# Patient Record
Sex: Male | Born: 1955 | Race: White | Hispanic: No | Marital: Married
Health system: Midwestern US, Academic
[De-identification: ages and names within clinical notes are randomized; demographics above are authoritative.]

---

## 2016-06-16 MED ORDER — ISOSORBIDE MONONITRATE 30 MG PO TB24
30 mg | ORAL_TABLET | Freq: Every morning | ORAL | 3 refills | 30.00000 days | Status: DC
Start: 2016-06-16 — End: 2017-06-13

## 2016-07-10 ENCOUNTER — Encounter: Admit: 2016-07-10 | Discharge: 2016-07-10 | Payer: MEDICARE

## 2016-07-10 MED ORDER — FUROSEMIDE 40 MG PO TAB
ORAL_TABLET | Freq: Every day | ORAL | 3 refills | 90.00000 days | Status: AC
Start: 2016-07-10 — End: 2017-07-13

## 2016-07-10 MED ORDER — CARVEDILOL 6.25 MG PO TAB
ORAL_TABLET | Freq: Two times a day (BID) | ORAL | 3 refills | 90.00000 days | Status: AC
Start: 2016-07-10 — End: 2017-07-13

## 2016-07-12 ENCOUNTER — Encounter: Admit: 2016-07-12 | Discharge: 2016-07-12 | Payer: MEDICARE

## 2016-07-12 NOTE — Progress Notes
Records Request    Medical records request for continuation of care:    Patient has appointment on 07/27/16   with  Dr. Danella MaiersSteven Owens* .    Please fax records to [facility]  [phone]    Request records:        Recent Labs      Thank you,      [facility]  [facility]  [address]  [place], [place] [id number]  Phone:  [phone]  Fax:  [phone]

## 2016-07-14 LAB — CBC
Lab: 15 mg/dL (ref 8.5–10.6)
Lab: 192 U/L — ABNORMAL HIGH (ref 0.00–1.00)
Lab: 31 g/dL (ref 3.5–5.0)
Lab: 33 U/L (ref 25–110)
Lab: 4.9 mg/dL — ABNORMAL HIGH (ref 21.0–35.0)
Lab: 46 g/dL (ref 6.0–8.0)
Lab: 6.6 mg/dL — ABNORMAL HIGH (ref 30–200)
Lab: 93 mg/dL (ref 0.3–1.2)

## 2016-07-14 LAB — COMPREHENSIVE METABOLIC PANEL
Lab: 141 MMOL/L (ref 98–110)
Lab: 25 (ref 3–12)
Lab: 43 mL/min (ref >60)
Lab: 9
Lab: >60
Lab: >60 mL/min (ref >60)

## 2016-07-14 LAB — LIPID PROFILE: Lab: 126 mg/dL (ref 70–100)

## 2016-07-17 ENCOUNTER — Encounter: Admit: 2016-07-17 | Discharge: 2016-07-17 | Payer: MEDICARE

## 2016-07-27 ENCOUNTER — Encounter: Admit: 2016-07-27 | Discharge: 2016-07-27 | Payer: MEDICARE

## 2016-07-27 ENCOUNTER — Ambulatory Visit: Admit: 2016-07-27 | Discharge: 2016-07-28 | Payer: MEDICARE

## 2016-07-27 DIAGNOSIS — Z9981 Dependence on supplemental oxygen: Secondary | ICD-10-CM

## 2016-07-27 DIAGNOSIS — I429 Cardiomyopathy, unspecified: ICD-10-CM

## 2016-07-27 DIAGNOSIS — Z951 Presence of aortocoronary bypass graft: ICD-10-CM

## 2016-07-27 DIAGNOSIS — R0600 Dyspnea, unspecified: ICD-10-CM

## 2016-07-27 DIAGNOSIS — J45909 Unspecified asthma, uncomplicated: ICD-10-CM

## 2016-07-27 DIAGNOSIS — E7800 Pure hypercholesterolemia, unspecified: ICD-10-CM

## 2016-07-27 DIAGNOSIS — R000 Tachycardia, unspecified: ICD-10-CM

## 2016-07-27 DIAGNOSIS — I2510 Atherosclerotic heart disease of native coronary artery without angina pectoris: Principal | ICD-10-CM

## 2016-07-27 DIAGNOSIS — I119 Hypertensive heart disease without heart failure: ICD-10-CM

## 2016-07-27 DIAGNOSIS — G2581 Restless legs syndrome: ICD-10-CM

## 2016-07-27 DIAGNOSIS — E785 Hyperlipidemia, unspecified: ICD-10-CM

## 2016-07-27 DIAGNOSIS — Z91041 Radiographic dye allergy status: ICD-10-CM

## 2016-07-27 DIAGNOSIS — J449 Chronic obstructive pulmonary disease, unspecified: ICD-10-CM

## 2016-07-27 DIAGNOSIS — Z87891 Personal history of nicotine dependence: ICD-10-CM

## 2016-07-27 DIAGNOSIS — I10 Essential (primary) hypertension: ICD-10-CM

## 2016-07-27 DIAGNOSIS — I5022 Chronic systolic (congestive) heart failure: ICD-10-CM

## 2016-07-27 DIAGNOSIS — I255 Ischemic cardiomyopathy: ICD-10-CM

## 2016-07-27 NOTE — Assessment & Plan Note
No angina or other symptoms to suggest progression of coronary disease.

## 2016-07-27 NOTE — Assessment & Plan Note
Lab Results   Component Value Date    CHOL 126 07/14/2016    TRIG 207 (H) 07/14/2016    HDL 35 07/14/2016    LDL 72.0 07/14/2016    VLDL 36 05/06/2012    NONHDLCHOL 86 05/06/2012    CHOLHDLC 3.7 01/04/2011

## 2016-07-27 NOTE — Assessment & Plan Note
He is always had a tendency to have a rather rapid heart rate and this is particularly the case when he exerts at all.

## 2016-07-27 NOTE — Progress Notes
Date of Service: 07/27/2016    Joseph Dougherty is a 61 y.o. male.       HPI     [name] and Pam were in the [place] office today for follow-up regarding coronary disease.  He has had progressive difficulty with his breathing, related to his severe COPD.  He tends to get a fairly rapid heart rate when he does much exertion at all.  Fortunately she has not required hospitalization since I last saw him 6 months ago and he attributes this to the azithromycin that he has been taking 3 times per week.    He has not had any angina.  He denies any syncope or near syncope.  He has not had trouble with peripheral edema.         Vitals:    07/27/16 1354 07/27/16 1400   BP: 124/86 132/84   Pulse: 100    Weight: 79.8 kg (176 lb)    Height: 1.702 m (5' 7)      Body mass index is 27.57 kg/m???.     Past Medical History  Patient Active Problem List    Diagnosis Date Noted   ??? Ischemic cardiomyopathy 05/31/2012   ??? Dependence on supplemental oxygen 05/31/2012   ??? Dyspnea on exertion 05/31/2012   ??? 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 Restless legs syndrome 01/17/2011   ??? Sinus tachycardia 08/25/2008   ??? History of tobacco abuse 08/25/2008   ??? Hypercholesterolemia 08/25/2008   ??? Allergy to radiographic contrast media 01/15/2007   ??? Exertional chest pain 01/14/2007     Abnormal stress thallium     ??? CAD (coronary artery disease) 03/30/2006      9/05 - Inferior MI with thrombolytic, anatomy not suitable for PCI   9/05 - Emergent CABG x 2: SVG-right posterolateral and SVG-ramus.   11/05 - ECOD: LVEF 50%, mild posterior hypokinesis.  (EF 59% per LVgram).                 Status post cardiac rehab participation.   5/06 - Persantine Myoview stress test ([place], [place]):  EF 60%, no ischemia   12/08 - Cardiac cath: mild LV dysfunction with previous inferior wall infarction.                Two vessel CAD with patent vein graft to intermediate vessel and patent                vein graft to RPLV branch. No significant LAD or Cx disease. Allergic reaction to contrast dye with manifestation of hives treated with Benadryl                and Solu-Medrol.  05/2012 - Cath @ [facility].  No significant progression of CAD.  Grafts patent.  EF 45-50%.     ??? COPD (chronic obstructive pulmonary disease) (HCC) 03/30/2006     He has a long smoking history and was first told he had COPD in 2008 and initially saw Dr. Trixie Dredge. Earlene Plater in the fellows clinic.  He has required hospitalization nearly yearly the last 3-4 years.  He thinks he may have had a pneumothorax in the past also.  He was started on nocturnal oxygen late 2008.   He has normal alpha one antitrypsin testing in 2008.   He quit smoking in 1/13.  He has been on Spiriva and Advair for several years but switched to Symbicort in 2014.  Azithromycin started 11/2013  Review of Systems   Constitution: Positive for weakness and malaise/fatigue.   HENT: Positive for congestion, ear pain and stridor.    Eyes: Negative.    Cardiovascular: Positive for claudication, dyspnea on exertion, near-syncope and paroxysmal nocturnal dyspnea.   Respiratory: Positive for cough, shortness of breath, sleep disturbances due to breathing, sputum production and wheezing.    Endocrine: Positive for cold intolerance and heat intolerance.   Hematologic/Lymphatic: Negative.    Skin: Positive for dry skin.   Musculoskeletal: Positive for muscle cramps.   Gastrointestinal: Positive for bloating.   Genitourinary: Positive for nocturia.   Psychiatric/Behavioral: The patient has insomnia.    Allergic/Immunologic: Negative.        Physical Exam    Physical Exam   General Appearance: no distress   Skin: warm, no ulcers or xanthomas   Digits and Nails: no cyanosis or clubbing   Eyes: conjunctivae and lids normal, pupils are equal and round   Teeth/Gums/Palate: dentition unremarkable, no lesions   Lips & Oral Mucosa: no pallor or cyanosis   Neck Veins: normal JVP , neck veins are not distended Thyroid: no nodules, masses, tenderness or enlargement   Chest Inspection: chest is normal in appearance   Respiratory Effort: mild dyspnea at rest  Auscultation/Percussion: lungs clear to auscultation, no rales or rhonchi, no wheezing, markedly diminished breath sounds bilaterally  PMI: PMI not enlarged or displaced   Cardiac Rhythm: regular rhythm and normal rate   Cardiac Auscultation: S1, S2 normal, no rub, no gallop   Murmurs: no murmur   Peripheral Circulation: normal peripheral circulation   Carotid Arteries: normal carotid upstroke bilaterally, no bruits   Radial Arteries: normal symmetric radial pulses   Abdominal Aorta: no abdominal aortic bruit   Pedal Pulses: normal symmetric pedal pulses   Lower Extremity Edema: no lower extremity edema   Abdominal Exam: soft, non-tender, no masses, bowel sounds normal   Liver & Spleen: no organomegaly   Gait & Station: walks without assistance   Muscle Strength: normal muscle tone   Orientation: oriented to time, place and person   Affect & Mood: appropriate and sustained affect   Language and Memory: patient responsive and seems to comprehend information   Neurologic Exam: neurological assessment grossly intact   Other: moves all extremities      Problems Addressed Today  Encounter Diagnoses   Name Primary?   ??? Coronary artery disease involving native coronary artery of native heart without angina pectoris    ??? Hypercholesterolemia        Assessment and Plan       CAD (coronary artery disease)  No angina or other symptoms to suggest progression of coronary disease.    COPD (chronic obstructive pulmonary disease)  He is actually done well on azithromycin 3 times per week and has not been hospitalized since I saw him 6 months ago.    Sinus tachycardia  He is always had a tendency to have a rather rapid heart rate and this is particularly the case when he exerts at all.    Hypercholesterolemia  Lab Results   Component Value Date    CHOL 126 07/14/2016 TRIG 207 (H) 07/14/2016    HDL 35 07/14/2016    LDL 72.0 07/14/2016    VLDL 36 05/06/2012    NONHDLCHOL 86 05/06/2012    CHOLHDLC 3.7 01/04/2011            Current Medications (including today's revisions)  ??? albuterol 0.083% (PROVENTIL; VENTOLIN) 2.5 mg /3 mL (0.083 %)  nebulizer solution Inhale 2.5 mg solution as directed every 4 hours as needed.     ??? aspirin 325 mg tablet Take 325 mg by mouth daily.   ??? atorvastatin (LIPITOR) 40 mg tablet TAKE 1 TABLET DAILY   ??? azithromycin (ZITHROMAX) 250 mg tablet 1 Tab three times weekly. Marland Kitchen   ??? budesonide/formoterol (SYMBICORT) 80-4.5 mcg/actuation HFAA inhalation Inhale 2 Puffs by mouth twice daily.   ??? carvedilol (COREG) 6.25 mg tablet TAKE 1 TABLET TWICE A DAY WITH MEALS   ??? ezetimibe (ZETIA) 10 mg tablet Take 1 tablet by mouth daily.   ??? furosemide (LASIX) 40 mg tablet TAKE 1 TABLET DAILY   ??? isosorbide mononitrate SR (IMDUR) 30 mg tablet Take 1 tablet by mouth every morning.   ??? nitroglycerin (NITROSTAT) 0.4 mg tablet 1 Tab every 5 minutes as needed for Chest Pain. FOR CHEST PAIN   ??? potassium chloride SR (K-DUR) 20 mEq tablet TAKE 1 TABLET DAILY   ??? sertraline (ZOLOFT) 100 mg tablet Take 100 mg by mouth daily.   ??? temazepam (RESTORIL) 15 mg capsule Take 1 Cap by mouth at bedtime as needed.   ??? tiotropium (SPIRIVA WITH HANDIHALER) 18 mcg inhaler Inhale 18 mcg by mouth Daily.

## 2016-07-27 NOTE — Assessment & Plan Note
He is actually done well on azithromycin 3 times per week and has not been hospitalized since I saw him 6 months ago.

## 2016-07-31 ENCOUNTER — Encounter: Admit: 2016-07-31 | Discharge: 2016-07-31 | Payer: MEDICARE

## 2016-07-31 MED ORDER — ATORVASTATIN 40 MG PO TAB
ORAL_TABLET | Freq: Every day | 3 refills | Status: AC
Start: 2016-07-31 — End: 2017-07-23

## 2016-08-14 ENCOUNTER — Encounter: Admit: 2016-08-14 | Discharge: 2016-08-14 | Payer: MEDICARE

## 2016-08-14 MED ORDER — POTASSIUM CHLORIDE 20 MEQ PO TBTQ
ORAL_TABLET | Freq: Every day | 2 refills | 30.00000 days | Status: AC
Start: 2016-08-14 — End: 2016-10-03

## 2016-09-15 ENCOUNTER — Encounter: Admit: 2016-09-15 | Discharge: 2016-09-15 | Payer: MEDICARE

## 2016-09-15 DIAGNOSIS — Z9981 Dependence on supplemental oxygen: Secondary | ICD-10-CM

## 2016-09-15 DIAGNOSIS — R000 Tachycardia, unspecified: ICD-10-CM

## 2016-09-15 DIAGNOSIS — J449 Chronic obstructive pulmonary disease, unspecified: ICD-10-CM

## 2016-09-15 DIAGNOSIS — R079 Chest pain, unspecified: ICD-10-CM

## 2016-09-15 DIAGNOSIS — R0600 Dyspnea, unspecified: ICD-10-CM

## 2016-09-15 DIAGNOSIS — E785 Hyperlipidemia, unspecified: ICD-10-CM

## 2016-09-15 DIAGNOSIS — Z951 Presence of aortocoronary bypass graft: ICD-10-CM

## 2016-09-15 DIAGNOSIS — J45909 Unspecified asthma, uncomplicated: ICD-10-CM

## 2016-09-15 DIAGNOSIS — I10 Essential (primary) hypertension: ICD-10-CM

## 2016-09-15 DIAGNOSIS — I255 Ischemic cardiomyopathy: ICD-10-CM

## 2016-09-15 DIAGNOSIS — I429 Cardiomyopathy, unspecified: ICD-10-CM

## 2016-09-15 DIAGNOSIS — I5022 Chronic systolic (congestive) heart failure: ICD-10-CM

## 2016-09-15 DIAGNOSIS — E7800 Pure hypercholesterolemia, unspecified: ICD-10-CM

## 2016-09-15 DIAGNOSIS — Z91041 Radiographic dye allergy status: ICD-10-CM

## 2016-09-15 DIAGNOSIS — I2510 Atherosclerotic heart disease of native coronary artery without angina pectoris: ICD-10-CM

## 2016-09-15 DIAGNOSIS — G2581 Restless legs syndrome: ICD-10-CM

## 2016-09-15 DIAGNOSIS — Z87891 Personal history of nicotine dependence: ICD-10-CM

## 2016-09-15 DIAGNOSIS — I119 Hypertensive heart disease without heart failure: ICD-10-CM

## 2016-09-15 MED ORDER — ASPIRIN 325 MG PO TAB
325 mg | Freq: Once | ORAL | 0 refills | Status: DC
Start: 2016-09-15 — End: 2016-09-16

## 2016-09-16 ENCOUNTER — Inpatient Hospital Stay: Admit: 2016-09-16 | Discharge: 2016-09-16 | Payer: MEDICARE

## 2016-09-16 ENCOUNTER — Emergency Department: Admit: 2016-09-16 | Discharge: 2016-09-16 | Payer: MEDICARE

## 2016-09-16 ENCOUNTER — Inpatient Hospital Stay
Admit: 2016-09-16 | Discharge: 2016-09-16 | Disposition: A | Discharge status: Home / Self Care | Payer: MEDICARE | Admitting: Student in an Organized Health Care Education/Training Program

## 2016-09-16 ENCOUNTER — Encounter: Admit: 2016-09-16 | Discharge: 2016-09-16 | Payer: MEDICARE

## 2016-09-16 DIAGNOSIS — I252 Old myocardial infarction: ICD-10-CM

## 2016-09-16 DIAGNOSIS — I255 Ischemic cardiomyopathy: ICD-10-CM

## 2016-09-16 DIAGNOSIS — R000 Tachycardia, unspecified: ICD-10-CM

## 2016-09-16 DIAGNOSIS — Z9981 Dependence on supplemental oxygen: ICD-10-CM

## 2016-09-16 DIAGNOSIS — Z79899 Other long term (current) drug therapy: ICD-10-CM

## 2016-09-16 DIAGNOSIS — I4510 Unspecified right bundle-branch block: ICD-10-CM

## 2016-09-16 DIAGNOSIS — I5022 Chronic systolic (congestive) heart failure: ICD-10-CM

## 2016-09-16 DIAGNOSIS — Z951 Presence of aortocoronary bypass graft: ICD-10-CM

## 2016-09-16 DIAGNOSIS — Z87891 Personal history of nicotine dependence: ICD-10-CM

## 2016-09-16 DIAGNOSIS — G2581 Restless legs syndrome: ICD-10-CM

## 2016-09-16 DIAGNOSIS — R0789 Other chest pain: Principal | ICD-10-CM

## 2016-09-16 DIAGNOSIS — I110 Hypertensive heart disease with heart failure: ICD-10-CM

## 2016-09-16 DIAGNOSIS — E7800 Pure hypercholesterolemia, unspecified: ICD-10-CM

## 2016-09-16 DIAGNOSIS — J9610 Chronic respiratory failure, unspecified whether with hypoxia or hypercapnia: ICD-10-CM

## 2016-09-16 DIAGNOSIS — E785 Hyperlipidemia, unspecified: ICD-10-CM

## 2016-09-16 DIAGNOSIS — J449 Chronic obstructive pulmonary disease, unspecified: ICD-10-CM

## 2016-09-16 DIAGNOSIS — I25119 Atherosclerotic heart disease of native coronary artery with unspecified angina pectoris: ICD-10-CM

## 2016-09-16 LAB — TROPONIN-I
Lab: 0 ng/mL (ref 0.0–0.05)
Lab: 0 ng/mL (ref 0.0–0.05)

## 2016-09-16 LAB — COMPREHENSIVE METABOLIC PANEL
Lab: 1 mg/dL (ref 0.4–1.24)
Lab: 1.4 mg/dL — ABNORMAL HIGH (ref 0.3–1.2)
Lab: 107 mg/dL — ABNORMAL HIGH (ref 70–100)
Lab: 137 MMOL/L (ref 137–147)
Lab: 16 mg/dL (ref 7–25)
Lab: 25 U/L (ref 7–40)
Lab: 35 MMOL/L — ABNORMAL HIGH (ref 21–30)
Lab: 4 MMOL/L (ref 3.5–5.1)
Lab: 4.6 g/dL (ref 3.5–5.0)
Lab: 46 U/L (ref 7–56)
Lab: 5 10*3/uL (ref 3–12)
Lab: 94 U/L — ABNORMAL LOW (ref 25–110)
Lab: >60 mL/min (ref >60)
Lab: >60 mL/min (ref >60)
Lab: 0.8 mg/dL (ref 0.4–1.24)
Lab: 1.5 mg/dL — ABNORMAL HIGH (ref 0.3–1.2)
Lab: 127 mg/dL — ABNORMAL HIGH (ref 70–100)
Lab: 136 MMOL/L — ABNORMAL LOW (ref 137–147)
Lab: 25 U/L (ref 7–40)
Lab: 3.8 MMOL/L (ref 3.5–5.1)
Lab: 31 MMOL/L — ABNORMAL HIGH (ref 21–30)
Lab: 4.3 g/dL (ref 3.5–5.0)
Lab: 43 U/L (ref 7–56)
Lab: 7 10*3/uL (ref 3–12)
Lab: 7.1 g/dL (ref 6.0–8.0)
Lab: 9.8 mg/dL (ref 8.5–10.6)
Lab: 90 U/L (ref 25–110)
Lab: >60 mL/min (ref >60)
Lab: >60 mL/min (ref >60)

## 2016-09-16 LAB — CBC AND DIFF
Lab: 0 10*3/uL (ref 0–0.20)
Lab: 0.2 10*3/uL (ref 0–0.45)
Lab: 7.2 10*3/uL (ref 4.5–11.0)
Lab: 0 10*3/uL (ref 0–0.20)
Lab: 4.6 M/UL (ref 4.4–5.5)
Lab: 6.3 10*3/uL (ref 4.5–11.0)

## 2016-09-16 LAB — TSH WITH FREE T4 REFLEX: Lab: 6.7 uU/mL — ABNORMAL HIGH (ref 0.35–5.00)

## 2016-09-16 LAB — PROTIME INR (PT): Lab: 1 mg/dL (ref 0.8–1.2)

## 2016-09-16 LAB — PHOSPHORUS: Lab: 3 mg/dL (ref 2.0–4.0)

## 2016-09-16 LAB — FREE T4-FREE THYROXINE: Lab: 1 ng/dL (ref 0.6–1.6)

## 2016-09-16 LAB — MAGNESIUM
Lab: 2.4 mg/dL — ABNORMAL LOW (ref 1.6–2.6)
Lab: 2.2 mg/dL (ref 1.6–2.6)

## 2016-09-16 LAB — BNP POC ER: Lab: 46 pg/mL (ref 0–100)

## 2016-09-16 MED ORDER — SODIUM CHLORIDE 0.9 % IV SOLP
250 mL | INTRAVENOUS | 0 refills | Status: DC | PRN
Start: 2016-09-16 — End: 2016-09-17

## 2016-09-16 MED ORDER — ALBUTEROL SULFATE 2.5 MG /3 ML (0.083 %) IN NEBU
2.5 mg | RESPIRATORY_TRACT | 0 refills | Status: DC | PRN
Start: 2016-09-16 — End: 2016-09-17

## 2016-09-16 MED ORDER — TEMAZEPAM 15 MG PO CAP
15 mg | Freq: Every evening | ORAL | 0 refills | Status: DC | PRN
Start: 2016-09-16 — End: 2016-09-17

## 2016-09-16 MED ORDER — ENOXAPARIN 40 MG/0.4 ML SC SYRG
40 mg | Freq: Every day | SUBCUTANEOUS | 0 refills | Status: DC
Start: 2016-09-16 — End: 2016-09-17
  Administered 2016-09-16: 08:00:00 40 mg via SUBCUTANEOUS

## 2016-09-16 MED ORDER — SERTRALINE 100 MG PO TAB
100 mg | Freq: Every day | ORAL | 0 refills | Status: DC
Start: 2016-09-16 — End: 2016-09-17
  Administered 2016-09-16: 17:00:00 100 mg via ORAL

## 2016-09-16 MED ORDER — NITROGLYCERIN 0.4 MG SL SUBL
.4 mg | SUBLINGUAL | 0 refills | Status: DC | PRN
Start: 2016-09-16 — End: 2016-09-17

## 2016-09-16 MED ORDER — ISOSORBIDE MONONITRATE 30 MG PO TB24
30 mg | Freq: Every morning | ORAL | 0 refills | Status: DC
Start: 2016-09-16 — End: 2016-09-17
  Administered 2016-09-16: 20:00:00 30 mg via ORAL

## 2016-09-16 MED ORDER — ATORVASTATIN 40 MG PO TAB
40 mg | Freq: Every day | ORAL | 0 refills | Status: DC
Start: 2016-09-16 — End: 2016-09-17
  Administered 2016-09-16: 17:00:00 40 mg via ORAL

## 2016-09-16 MED ORDER — MELATONIN 5 MG PO TAB
5 mg | Freq: Every evening | ORAL | 0 refills | Status: DC | PRN
Start: 2016-09-16 — End: 2016-09-17

## 2016-09-16 MED ORDER — ONDANSETRON HCL (PF) 4 MG/2 ML IJ SOLN
4 mg | INTRAVENOUS | 0 refills | Status: DC | PRN
Start: 2016-09-16 — End: 2016-09-17

## 2016-09-16 MED ORDER — BUDESONIDE-FORMOTEROL 80-4.5 MCG/ACTUATION IN HFAA
2 | Freq: Two times a day (BID) | RESPIRATORY_TRACT | 0 refills | Status: DC
Start: 2016-09-16 — End: 2016-09-17
  Administered 2016-09-16: 11:00:00 2 via RESPIRATORY_TRACT

## 2016-09-16 MED ORDER — ASPIRIN 325 MG PO TAB
325 mg | Freq: Every day | ORAL | 0 refills | Status: DC
Start: 2016-09-16 — End: 2016-09-17
  Administered 2016-09-16: 17:00:00 325 mg via ORAL

## 2016-09-16 MED ORDER — POTASSIUM CHLORIDE 20 MEQ PO TBTQ
20 meq | Freq: Every day | ORAL | 0 refills | Status: DC
Start: 2016-09-16 — End: 2016-09-17
  Administered 2016-09-16: 20:00:00 20 meq via ORAL

## 2016-09-16 MED ORDER — FUROSEMIDE 40 MG PO TAB
40 mg | Freq: Every day | ORAL | 0 refills | Status: DC
Start: 2016-09-16 — End: 2016-09-17
  Administered 2016-09-16: 20:00:00 40 mg via ORAL

## 2016-09-16 MED ORDER — ALBUTEROL SULFATE 90 MCG/ACTUATION IN HFAA
2 | RESPIRATORY_TRACT | 0 refills | Status: DC | PRN
Start: 2016-09-16 — End: 2016-09-17

## 2016-09-16 MED ORDER — AMINOPHYLLINE 500 MG/20 ML IV SOLN
50 mg | INTRAVENOUS | 0 refills | Status: DC | PRN
Start: 2016-09-16 — End: 2016-09-17

## 2016-09-16 MED ORDER — REGADENOSON 0.4 MG/5 ML IV SYRG
.4 mg | Freq: Once | INTRAVENOUS | 0 refills | Status: CP
Start: 2016-09-16 — End: ?
  Administered 2016-09-16: 15:00:00 0.4 mg via INTRAVENOUS

## 2016-09-16 MED ORDER — EZETIMIBE 10 MG PO TAB
10 mg | Freq: Every day | ORAL | 0 refills | Status: DC
Start: 2016-09-16 — End: 2016-09-17
  Administered 2016-09-16: 17:00:00 10 mg via ORAL

## 2016-09-16 MED ORDER — TIOTROPIUM BROMIDE 18 MCG IN CPDV
1 | Freq: Every day | RESPIRATORY_TRACT | 0 refills | Status: DC
Start: 2016-09-16 — End: 2016-09-17
  Administered 2016-09-16: 11:00:00 1 via RESPIRATORY_TRACT

## 2016-09-16 MED ORDER — ACETAMINOPHEN 325 MG PO TAB
650 mg | ORAL | 0 refills | Status: DC | PRN
Start: 2016-09-16 — End: 2016-09-17

## 2016-09-16 MED ORDER — AZITHROMYCIN 250 MG PO TAB
250 mg | ORAL | 0 refills | Status: DC
Start: 2016-09-16 — End: 2016-09-17

## 2016-09-16 MED ORDER — SODIUM CHLORIDE 0.9 % IV SOLP
250 mL | Freq: Once | INTRAVENOUS | 0 refills | Status: CP
Start: 2016-09-16 — End: ?
  Administered 2016-09-16: 08:00:00 250 mL via INTRAVENOUS

## 2016-09-16 MED ORDER — CARVEDILOL 6.25 MG PO TAB
6.25 mg | Freq: Two times a day (BID) | ORAL | 0 refills | Status: DC
Start: 2016-09-16 — End: 2016-09-17
  Administered 2016-09-16: 17:00:00 6.25 mg via ORAL

## 2016-09-16 MED ORDER — SODIUM CHLORIDE 0.9 % IV SOLP
1000 mL | Freq: Once | INTRAVENOUS | 0 refills | Status: DC
Start: 2016-09-16 — End: 2016-09-16

## 2016-09-16 NOTE — Progress Notes
Patient arrived to room # 509 via transport accompanied. Patient transferred to the bed without assistance. Bedside safety checks completed. Initial patient assessment completed, refer to flowsheet for details. Admission skin assessment completed by: Percell BostonMeagan  RN    Pressure Injury Present on Hospital Admission (within 24 hours): no     1. Occiput: no  2. Ear: no  3. Scapula: no  4. Spinous Process: no  5. Shoulder: no  6. Elbow: no  7. Iliac Crest: no  8. Sacrum/Coccyx: no  9. Ischial Tuberosity: no  10. Trochanter: no  11. Knee: no  12. Malleolus: no  13. Heel: no  14. Toes: no  15. Assessed for device associated injury: no  16. Nursing Nutrition Assessment Completed: yes     See Doc Flowsheet for additional wound details.

## 2016-09-16 NOTE — Progress Notes
@   approx 1800  discharge complete. Iv and tele removed prior to discharge without any difficulty or issues. Pt v/u of all discharge instructions. Pt connected himself to his portable 02 tank. Pt transported to lobby per wheelchair by myself, pts wife @ curbside to drive pt home. Blima RichJill  RN

## 2016-09-16 NOTE — ED Notes
Report given to Aundra MilletMegan, RN - CVP

## 2016-09-16 NOTE — Care Coordination-Inpatient
Patient assigned to Med 3. Pager [phone]

## 2016-09-16 NOTE — Discharge Instructions - Appointments
Please call your cardiologist on Monday 09-18-16 to ask if they need a hospital follow-up appointment for your short admission and perfusion scan.

## 2016-09-16 NOTE — Progress Notes
Heart Failure Nursing Progress Note    Admission Date: 09/15/2016  LOS: 0 days    Admission Weight: 76.4 kg (168 lb 8 oz)        Most recent weights (inpatient):   Vitals:    09/16/16 0256   Weight: 76.4 kg (168 lb 8 oz)     Weight change from previous day: no previous weight    Fluid restriction ordered: NPO at this time    Intake/Output Summary: (Last 24 hours)    Intake/Output Summary (Last 24 hours) at 09/16/16 0457  Last data filed at 09/16/16 0428   Gross per 24 hour   Intake                0 ml   Output                0 ml   Net                0 ml       Is patient incontinent: No     Anticipated discharge date: TBD  Discharge goals: Stress test      Daily Assessment of Patient Stated Goals:    Short Term Goal Identified by patient (Short Term=during hospitalization):  "to eat soon"

## 2016-09-16 NOTE — Discharge Instructions - Pharmacy
Physician Discharge Summary      Name: Joseph Dougherty  Medical Record Number: [id number]        Account Number:  [id number]  Date Of Birth:  [birth year]                         Age:  61 years   Admit date:  09/15/2016                     Discharge date:  09/16/2016    Attending Physician:  Dr. Adrian Saran               Service: Med 3- 2071    Physician Summary completed by: Thelma Barge. Joseph Russomanno, MD    Reason for hospitalization: Chest pain    Significant PMH:   Past Medical History:   Diagnosis Date   ??? Allergy to radiographic contrast media 01/15/2007   ??? Asthma    ??? cad    ??? CAD (coronary artery disease)    ??? Cardiomyopathy (HCC)    ??? Chronic systolic heart failure (HCC) 05/31/2012   ??? COPD (chronic obstructive pulmonary disease) (HCC) 03/30/2006   ??? Dependence on supplemental oxygen 05/31/2012   ??? Dyspnea on exertion 05/31/2012   ??? History of tobacco abuse 08/25/2008   ??? 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 Hypercholesterolemia 08/25/2008   ??? Hyperlipemia    ??? Hypertension    ??? Hypertensive cardiovascular disease    ??? Ischemic cardiomyopathy 05/31/2012   ??? On supplemental oxygen therapy    ??? Restless legs syndrome 01/17/2011   ??? Sinus tachycardia 08/25/2008         Allergies: Contrast dye iv, iodine containing [iodinated contrast- oral and iv dye]; Effexor [venlafaxine]; Penicillin g; and Shellfish containing products    Admission Physical Exam notable for:  Vitals: BP: 145/90 (08/10 2300)  Temp: 37.1 ???C (98.7 ???F) (08/10 2003)  Pulse: 95 (08/10 2300)  Respirations: 13 PER MINUTE (08/10 2300)  SpO2: 97 % (08/10 2300)  O2 Delivery: Nasal Cannula (08/10 2003)  SpO2 Pulse: 90 (08/10 2300)  General:  Alert and response to question appropriately.  VS as above. No acute distress .     Lungs:  No use of accessory muscles.  Clear to auscultation bilaterally without wheezes, rales, or rhonchi. Resting comfortably on 3L of O2 vial NC   Cardiovascular: Regular rate, S1, S2 normal, no murmurs, clicks, rubs, or gallops appreciated .  2+ and symmetric, all extremities.  No edema in BLE.     Admission Lab/Radiology studies notable for:   Troponin-I 0.03  BNP 46  CT Head: No acute hemorrhage  TSH: 6.790, T4-Free 1.0  Chest X-ray: Heart and pulmonary vasculature within normal limits    Brief Hospital Course:  The patient was admitted and the following issues were addressed during this hospitalization: (with pertinent details).  Joseph Dougherty is a 61 yo M with a PMHx significant for CAD s/p CABG x2, COPD, sinus tachycardia presenting with chest pain.  The patients chest pain was thoroughly evaluated with CXR, lab tests, history and physical exam.  The course and character of his chest pain was non-cardiac in nature with no acute signs of ischemia or other life threatening pathology.  However, given Joseph Dougherty risk factors, he was admitted for an MPI Stress Test.  The stress test was ordered and by morning, Joseph Dougherty chest pain had entirely resolved.  The stress test was negative for ischemia  and the perfusion was similar to his last stress test done in 05/2011, indicating no significant interval change.  Joseph Dougherty was informed of the results and asked to see if his cardiologist wanted a follow-up to go over the test results by Monday August 13th, 2018.  He was discharged at his normal baseline symptoms and functional level.      Condition at Discharge: Stable    Discharge Diagnoses:      Hospital Problems        Active Problems    * (Principal)Other chest pain    CAD (coronary artery disease)    COPD (chronic obstructive pulmonary disease) (HCC)    Sinus tachycardia    Chest pain          Surgical Procedures: None    Significant Diagnostic Studies and Procedures: noted in brief hospital course    Consults:  None    Patient Disposition: Home       Patient instructions/medications:     Activity as Tolerated   It is important to keep increasing your activity level after you leave the hospital.  Moving around can help prevent blood clots, lung infection (pneumonia) and other problems.  Gradually increasing the number of times you are up moving around will help you return to your normal activity level more quickly.  Continue to increase the number of times you are up to the chair and walking daily to return to your normal activity level. Begin to work toward your normal activity level at discharge     Questions About Your Stay   For questions or concerns regarding your hospital stay. Call [phone]   Discharging attending physician: Adrian Saran [[id number]]      Cardiac Diet   Limiting unhealthy fats and cholesterol is the most important step you can take in reducing your risk for cardiovascular disease.  Unhealthy fats include saturated and trans fats.  Monitor your sodium and cholesterol intake.  Restrict your sodium to 2g (grams) or 2000mg  (milligrams) daily, and your cholesterol to 200mg  daily.    If you have questions regarding your diet at home, you may contact a dietitian at [phone].       Report These Signs and Symptoms   Please contact your doctor if you have any of the following symptoms: temperature higher than 100 degrees F, uncontrolled pain, persistent nausea and/or vomiting, difficulty breathing, chest pain, severe abdominal pain, headache, unable to urinate, unable to have bowel movement or drainage with a foul odor     Heart Failure Information   You are at risk for readmission to the hospital because of fluid overload. There are steps you can take to lower your risk:  *Continue your current heart medications. Changes made have been made during your hospital stay.  *Remember to weigh yourself every morning, first thing after you urinate, and write down your weigh. Take this record to your doctor's appointments.  *Chart your symptoms - fatigue, shortness of breath, swelling, etc. Immediately report any worsening. *Remember to consume no more than 2,000mg  (milligrams) of sodium daily. Watch out for packaged, processed, canned, and restaurant foods especially.  *If any of your symptoms worsen, or if you gain more than 2 pounds in 24 hours, or 5 pounds in a week, call your cardiologist immediately. EARLY REPORTING OF THESE CHANGES IS VERY IMPORTANT.  Condition Code        Current Discharge Medication List       CONTINUE these medications which have NOT CHANGED  Details   albuterol 0.083% (PROVENTIL; VENTOLIN) 2.5 mg /3 mL (0.083 %) nebulizer solution Inhale 2.5 mg solution as directed every 4 hours as needed.      PRESCRIPTION TYPE:  Historical Med      aspirin 325 mg tablet Take 325 mg by mouth daily.    PRESCRIPTION TYPE:  Historical Med      atorvastatin (LIPITOR) 40 mg tablet TAKE 1 TABLET DAILY  Qty: 90 tablet, Refills: 3    PRESCRIPTION TYPE:  Normal      azithromycin (ZITHROMAX) 250 mg tablet 1 Tab three times weekly. Rob Bunting: 30 Tab, Refills: 4    PRESCRIPTION TYPE:  Normal      budesonide/formoterol (SYMBICORT) 80-4.5 mcg/actuation HFAA inhalation Inhale 2 Puffs by mouth twice daily.    PRESCRIPTION TYPE:  Historical Med      carvedilol (COREG) 6.25 mg tablet TAKE 1 TABLET TWICE A DAY WITH MEALS  Qty: 180 tablet, Refills: 3    PRESCRIPTION TYPE:  Normal      ezetimibe (ZETIA) 10 mg tablet Take 1 tablet by mouth daily.  Qty: 90 tablet, Refills: 3    PRESCRIPTION TYPE:  Normal      furosemide (LASIX) 40 mg tablet TAKE 1 TABLET DAILY  Qty: 90 tablet, Refills: 3    PRESCRIPTION TYPE:  Normal      isosorbide mononitrate SR (IMDUR) 30 mg tablet Take 1 tablet by mouth every morning.  Qty: 90 tablet, Refills: 3    PRESCRIPTION TYPE:  Normal      nitroglycerin (NITROSTAT) 0.4 mg tablet 1 Tab every 5 minutes as needed for Chest Pain. FOR CHEST PAIN  Qty: 25 Tab, Refills: 3    PRESCRIPTION TYPE:  Normal      potassium chloride SR (K-DUR) 20 mEq tablet TAKE 1 TABLET DAILY  Qty: 90 tablet, Refills: 2 PRESCRIPTION TYPE:  Normal      sertraline (ZOLOFT) 100 mg tablet Take 100 mg by mouth daily.    PRESCRIPTION TYPE:  Historical Med      temazepam (RESTORIL) 15 mg capsule Take 1 Cap by mouth at bedtime as needed.  Qty: 30 Cap, Refills: 1    PRESCRIPTION TYPE:  Phone In      tiotropium (SPIRIVA WITH HANDIHALER) 18 mcg inhaler Inhale 18 mcg by mouth Daily.    PRESCRIPTION TYPE:  Historical Med              Scheduled appointments:     Please call your cardiologist on Monday 09-18-16 to ask if they need a hospital follow-up appointment for your short admission and perfusion scan.                 Pending items needing follow up: Please call to see if your cardiologist would like a hospital follow-up.    Signed:  Zabrina Brotherton M. Zayah Keilman, MD  09/16/2016      cc:  Primary Care Physician:  Lona Kettle  Referring physicians:  No ref. provider found   Additional provider(s):

## 2016-09-18 ENCOUNTER — Encounter: Admit: 2016-09-18 | Discharge: 2016-09-18 | Payer: MEDICARE

## 2016-10-03 ENCOUNTER — Ambulatory Visit: Admit: 2016-10-03 | Discharge: 2016-10-04 | Payer: MEDICARE

## 2016-10-03 ENCOUNTER — Encounter: Admit: 2016-10-03 | Discharge: 2016-10-03 | Payer: MEDICARE

## 2016-10-03 DIAGNOSIS — I2510 Atherosclerotic heart disease of native coronary artery without angina pectoris: Principal | ICD-10-CM

## 2016-10-03 DIAGNOSIS — J45909 Unspecified asthma, uncomplicated: ICD-10-CM

## 2016-10-03 DIAGNOSIS — E7800 Pure hypercholesterolemia, unspecified: ICD-10-CM

## 2016-10-03 DIAGNOSIS — I429 Cardiomyopathy, unspecified: ICD-10-CM

## 2016-10-03 DIAGNOSIS — I5022 Chronic systolic (congestive) heart failure: ICD-10-CM

## 2016-10-03 DIAGNOSIS — I10 Essential (primary) hypertension: ICD-10-CM

## 2016-10-03 DIAGNOSIS — R0600 Dyspnea, unspecified: ICD-10-CM

## 2016-10-03 DIAGNOSIS — Z951 Presence of aortocoronary bypass graft: ICD-10-CM

## 2016-10-03 DIAGNOSIS — Z9981 Dependence on supplemental oxygen: ICD-10-CM

## 2016-10-03 DIAGNOSIS — E785 Hyperlipidemia, unspecified: ICD-10-CM

## 2016-10-03 DIAGNOSIS — I119 Hypertensive heart disease without heart failure: ICD-10-CM

## 2016-10-03 DIAGNOSIS — Z87891 Personal history of nicotine dependence: ICD-10-CM

## 2016-10-03 DIAGNOSIS — I255 Ischemic cardiomyopathy: ICD-10-CM

## 2016-10-03 DIAGNOSIS — J449 Chronic obstructive pulmonary disease, unspecified: ICD-10-CM

## 2016-10-03 DIAGNOSIS — Z91041 Radiographic dye allergy status: ICD-10-CM

## 2016-10-03 DIAGNOSIS — G2581 Restless legs syndrome: ICD-10-CM

## 2016-10-03 DIAGNOSIS — R000 Tachycardia, unspecified: ICD-10-CM

## 2016-10-03 MED ORDER — POTASSIUM CHLORIDE 20 MEQ/15 ML PO LIQD
20 meq | Freq: Every day | ORAL | 11 refills | 45.00000 days | Status: AC
Start: 2016-10-03 — End: 2017-08-08

## 2016-10-03 NOTE — Assessment & Plan Note
He has a lot of trouble trying to swallow the potassium tablets so I gave him a prescription for the potassium solution today.

## 2016-10-03 NOTE — Progress Notes
Date of Service: 10/03/2016    Joseph Dougherty is a 61 y.o. male.       HPI     Joseph Dougherty and Joseph Dougherty were in the [place] office today for a quick posthospitalization visit.  He had gone into [facility] earlier in the month with an episode of chest discomfort that it turns out to have been related to indigestion.  Myocardial infarction was ruled out and he had a nonischemic perfusion study.  Since that time he has had no further problems with chest discomfort.    He is trying to swallow potassium tablets and has a great deal of difficulty.  We went ahead and switched him over to the potassium solution today.         Vitals:    10/03/16 1036 10/03/16 1055   BP: 110/78 106/76   Pulse: 91    SpO2: (!) 90%    Weight: 79.8 kg (176 lb)    Height: 1.702 m (5' 7)      Body mass index is 27.57 kg/m???.     Past Medical History  Patient Active Problem List    Diagnosis Date Noted   ??? Other chest pain 09/16/2016   ??? Chest pain 09/16/2016   ??? Ischemic cardiomyopathy 05/31/2012   ??? Dependence on supplemental oxygen 05/31/2012   ??? Dyspnea on exertion 05/31/2012   ??? 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 Restless legs syndrome 01/17/2011   ??? Sinus tachycardia 08/25/2008   ??? History of tobacco abuse 08/25/2008   ??? Hypercholesterolemia 08/25/2008   ??? Allergy to radiographic contrast media 01/15/2007   ??? Exertional chest pain 01/14/2007     Abnormal stress thallium     ??? CAD (coronary artery disease) 03/30/2006      9/05 - Inferior MI with thrombolytic, anatomy not suitable for PCI   9/05 - Emergent CABG x 2: SVG-right posterolateral and SVG-ramus.   11/05 - ECOD: LVEF 50%, mild posterior hypokinesis.  (EF 59% per LVgram).                 Status post cardiac rehab participation.   5/06 - Persantine Myoview stress test ([place], [place]):  EF 60%, no ischemia   12/08 - Cardiac cath: mild LV dysfunction with previous inferior wall infarction.                Two vessel CAD with patent vein graft to intermediate vessel and patent vein graft to RPLV branch. No significant LAD or Cx disease. Allergic                 reaction to contrast dye with manifestation of hives treated with Benadryl                and Solu-Medrol.  05/2012 - Cath @ [facility].  No significant progression of CAD.  Grafts patent.  EF 45-50%.     ??? COPD (chronic obstructive pulmonary disease) (HCC) 03/30/2006     He has a long smoking history and was first told he had COPD in 2008 and initially saw Dr. Trixie Dredge. Earlene Plater in the fellows clinic.  He has required hospitalization nearly yearly the last 3-4 years.  He thinks he may have had a pneumothorax in the past also.  He was started on nocturnal oxygen late 2008.   He has normal alpha one antitrypsin testing in 2008.   He quit smoking in 1/13.  He has been on Spiriva and Advair for several years but switched to Symbicort in  2014.  Azithromycin started 11/2013             Review of Systems   Constitution: Positive for weakness.   HENT: Positive for congestion and stridor.    Eyes: Negative.    Cardiovascular: Positive for claudication, dyspnea on exertion and orthopnea.   Respiratory: Positive for cough, shortness of breath, sleep disturbances due to breathing, sputum production and wheezing.    Endocrine: Positive for cold intolerance and heat intolerance.   Hematologic/Lymphatic: Negative.    Skin: Negative.    Musculoskeletal: Positive for muscle cramps.   Gastrointestinal: Negative.    Genitourinary: Negative.    Neurological: Positive for excessive daytime sleepiness.   Psychiatric/Behavioral: Positive for depression.   Allergic/Immunologic: Negative.        Physical Exam    Physical Exam   General Appearance: no distress   Skin: warm, no ulcers or xanthomas   Digits and Nails: no cyanosis or clubbing   Eyes: conjunctivae and lids normal, pupils are equal and round   Teeth/Gums/Palate: dentition unremarkable, no lesions   Lips & Oral Mucosa: no pallor or cyanosis Neck Veins: normal JVP , neck veins are not distended   Thyroid: no nodules, masses, tenderness or enlargement   Chest Inspection: chest is normal in appearance   Respiratory Effort: breathing comfortably, no respiratory distress, using oxygen via nasal cannula   Auscultation/Percussion: lungs clear to auscultation, no rales or rhonchi, no wheezing, diffusely decreased breath sounds  PMI: PMI not enlarged or displaced   Cardiac Rhythm: regular rhythm and normal rate   Cardiac Auscultation: S1, S2 normal, no rub, no gallop   Murmurs: no murmur   Peripheral Circulation: normal peripheral circulation   Carotid Arteries: normal carotid upstroke bilaterally, no bruits   Radial Arteries: normal symmetric radial pulses   Abdominal Aorta: no abdominal aortic bruit   Pedal Pulses: normal symmetric pedal pulses   Lower Extremity Edema: no lower extremity edema   Abdominal Exam: soft, non-tender, no masses, bowel sounds normal   Liver & Spleen: no organomegaly   Gait & Station: walks without assistance   Muscle Strength: normal muscle tone   Orientation: oriented to time, place and person   Affect & Mood: appropriate and sustained affect   Language and Memory: patient responsive and seems to comprehend information   Neurologic Exam: neurological assessment grossly intact   Other: moves all extremities        Problems Addressed Today  Encounter Diagnoses   Name Primary?   ??? Coronary artery disease involving native coronary artery of native heart without angina pectoris    ??? Ischemic cardiomyopathy        Assessment and Plan       CAD (coronary artery disease)  Non-ischemic perfusion study earlier this month.  The symptoms causing his recent hospitalization seem to be GI in origin.  He has had no significant recurrence.    Ischemic cardiomyopathy  He has a lot of trouble trying to swallow the potassium tablets so I gave him a prescription for the potassium solution today.      Current Medications (including today's revisions) ??? albuterol 0.083% (PROVENTIL; VENTOLIN) 2.5 mg /3 mL (0.083 %) nebulizer solution Inhale 2.5 mg solution as directed every 4 hours as needed.     ??? aspirin 325 mg tablet Take 325 mg by mouth daily.   ??? atorvastatin (LIPITOR) 40 mg tablet TAKE 1 TABLET DAILY   ??? azithromycin (ZITHROMAX) 250 mg tablet 1 Tab three times weekly. Marland Kitchen   ???  budesonide/formoterol (SYMBICORT) 80-4.5 mcg/actuation HFAA inhalation Inhale 2 Puffs by mouth twice daily.   ??? carvedilol (COREG) 6.25 mg tablet TAKE 1 TABLET TWICE A DAY WITH MEALS   ??? ezetimibe (ZETIA) 10 mg tablet Take 1 tablet by mouth daily.   ??? furosemide (LASIX) 40 mg tablet TAKE 1 TABLET DAILY   ??? isosorbide mononitrate SR (IMDUR) 30 mg tablet Take 1 tablet by mouth every morning.   ??? nitroglycerin (NITROSTAT) 0.4 mg tablet 1 Tab every 5 minutes as needed for Chest Pain. FOR CHEST PAIN   ??? potassium chloride 20 mEq/15 mL oral solution Take 15 mL by mouth daily.   ??? sertraline (ZOLOFT) 100 mg tablet Take 100 mg by mouth daily.   ??? temazepam (RESTORIL) 15 mg capsule Take 1 Cap by mouth at bedtime as needed.   ??? tiotropium (SPIRIVA WITH HANDIHALER) 18 mcg inhaler Inhale 18 mcg by mouth Daily.

## 2016-10-03 NOTE — Assessment & Plan Note
Non-ischemic perfusion study earlier this month.  The symptoms causing his recent hospitalization seem to be GI in origin.  He has had no significant recurrence.

## 2016-10-05 ENCOUNTER — Encounter: Admit: 2016-10-05 | Discharge: 2016-10-05 | Payer: MEDICARE

## 2016-12-19 ENCOUNTER — Encounter: Admit: 2016-12-19 | Discharge: 2016-12-19 | Payer: MEDICARE

## 2016-12-19 MED ORDER — EZETIMIBE 10 MG PO TAB
ORAL_TABLET | Freq: Every day | 2 refills | Status: AC
Start: 2016-12-19 — End: 2017-09-10

## 2017-05-22 ENCOUNTER — Ambulatory Visit: Admit: 2017-05-22 | Discharge: 2017-05-23 | Payer: MEDICARE

## 2017-05-22 ENCOUNTER — Encounter: Admit: 2017-05-22 | Discharge: 2017-05-22 | Payer: MEDICARE

## 2017-05-22 DIAGNOSIS — Z9981 Dependence on supplemental oxygen: ICD-10-CM

## 2017-05-22 DIAGNOSIS — J45909 Unspecified asthma, uncomplicated: ICD-10-CM

## 2017-05-22 DIAGNOSIS — I5022 Chronic systolic (congestive) heart failure: ICD-10-CM

## 2017-05-22 DIAGNOSIS — I2510 Atherosclerotic heart disease of native coronary artery without angina pectoris: Principal | ICD-10-CM

## 2017-05-22 DIAGNOSIS — J449 Chronic obstructive pulmonary disease, unspecified: ICD-10-CM

## 2017-05-22 DIAGNOSIS — E7800 Pure hypercholesterolemia, unspecified: ICD-10-CM

## 2017-05-22 DIAGNOSIS — Z951 Presence of aortocoronary bypass graft: ICD-10-CM

## 2017-05-22 DIAGNOSIS — Z87891 Personal history of nicotine dependence: ICD-10-CM

## 2017-05-22 DIAGNOSIS — Z91041 Radiographic dye allergy status: ICD-10-CM

## 2017-05-22 DIAGNOSIS — I119 Hypertensive heart disease without heart failure: ICD-10-CM

## 2017-05-22 DIAGNOSIS — J432 Centrilobular emphysema: ICD-10-CM

## 2017-05-22 DIAGNOSIS — E785 Hyperlipidemia, unspecified: ICD-10-CM

## 2017-05-22 DIAGNOSIS — G2581 Restless legs syndrome: ICD-10-CM

## 2017-05-22 DIAGNOSIS — R0600 Dyspnea, unspecified: ICD-10-CM

## 2017-05-22 DIAGNOSIS — R000 Tachycardia, unspecified: ICD-10-CM

## 2017-05-22 DIAGNOSIS — I255 Ischemic cardiomyopathy: ICD-10-CM

## 2017-05-22 DIAGNOSIS — I10 Essential (primary) hypertension: ICD-10-CM

## 2017-05-22 DIAGNOSIS — I429 Cardiomyopathy, unspecified: ICD-10-CM

## 2017-05-28 ENCOUNTER — Encounter: Admit: 2017-05-28 | Discharge: 2017-05-28 | Payer: MEDICARE

## 2017-06-13 ENCOUNTER — Encounter: Admit: 2017-06-13 | Discharge: 2017-06-13 | Payer: MEDICARE

## 2017-06-13 MED ORDER — ISOSORBIDE MONONITRATE 30 MG PO TB24
ORAL_TABLET | Freq: Every morning | ORAL | 3 refills | 30.00000 days | Status: AC
Start: 2017-06-13 — End: 2018-06-10

## 2017-07-12 ENCOUNTER — Encounter: Admit: 2017-07-12 | Discharge: 2017-07-12 | Payer: MEDICARE

## 2017-07-13 MED ORDER — FUROSEMIDE 40 MG PO TAB
ORAL_TABLET | Freq: Every day | ORAL | 3 refills | 90.00000 days | Status: AC
Start: 2017-07-13 — End: 2018-07-08

## 2017-07-13 MED ORDER — CARVEDILOL 6.25 MG PO TAB
ORAL_TABLET | Freq: Two times a day (BID) | ORAL | 3 refills | 90.00000 days | Status: AC
Start: 2017-07-13 — End: 2018-07-08

## 2017-07-23 ENCOUNTER — Encounter: Admit: 2017-07-23 | Discharge: 2017-07-23 | Payer: MEDICARE

## 2017-07-23 DIAGNOSIS — E7800 Pure hypercholesterolemia, unspecified: Principal | ICD-10-CM

## 2017-07-23 MED ORDER — ATORVASTATIN 40 MG PO TAB
ORAL_TABLET | Freq: Every day | 3 refills | Status: AC
Start: 2017-07-23 — End: 2018-08-05

## 2017-08-08 ENCOUNTER — Encounter: Admit: 2017-08-08 | Discharge: 2017-08-08 | Payer: MEDICARE

## 2017-08-08 MED ORDER — POTASSIUM CHLORIDE 10 MEQ PO TBER
20 meq | ORAL_TABLET | Freq: Every day | ORAL | 3 refills | 30.00000 days | Status: AC
Start: 2017-08-08 — End: 2018-07-17

## 2017-08-30 ENCOUNTER — Encounter: Admit: 2017-08-30 | Discharge: 2017-08-30 | Payer: MEDICARE

## 2017-08-30 DIAGNOSIS — E7800 Pure hypercholesterolemia, unspecified: Principal | ICD-10-CM

## 2017-08-30 LAB — LIPID PROFILE
Lab: 240 — ABNORMAL HIGH
Lab: 122
Lab: 29
Lab: 4
Lab: 67

## 2017-08-30 LAB — COMPREHENSIVE METABOLIC PANEL
Lab: 110
Lab: 4.3
Lab: 63 — ABNORMAL HIGH
Lab: 1.6 — ABNORMAL HIGH
Lab: 141
Lab: 32
Lab: 4.1
Lab: 84

## 2017-09-10 ENCOUNTER — Encounter: Admit: 2017-09-10 | Discharge: 2017-09-10 | Payer: MEDICARE

## 2017-09-10 MED ORDER — EZETIMIBE 10 MG PO TAB
ORAL_TABLET | Freq: Every day | 2 refills | Status: AC
Start: 2017-09-10 — End: 2018-06-10

## 2017-12-13 ENCOUNTER — Encounter: Admit: 2017-12-13 | Discharge: 2017-12-13 | Payer: MEDICARE

## 2017-12-13 ENCOUNTER — Ambulatory Visit: Admit: 2017-12-13 | Discharge: 2017-12-14 | Payer: MEDICARE

## 2017-12-13 DIAGNOSIS — I5022 Chronic systolic (congestive) heart failure: ICD-10-CM

## 2017-12-13 DIAGNOSIS — R000 Tachycardia, unspecified: ICD-10-CM

## 2017-12-13 DIAGNOSIS — Z951 Presence of aortocoronary bypass graft: ICD-10-CM

## 2017-12-13 DIAGNOSIS — E785 Hyperlipidemia, unspecified: ICD-10-CM

## 2017-12-13 DIAGNOSIS — Z9981 Dependence on supplemental oxygen: Secondary | ICD-10-CM

## 2017-12-13 DIAGNOSIS — I429 Cardiomyopathy, unspecified: ICD-10-CM

## 2017-12-13 DIAGNOSIS — I10 Essential (primary) hypertension: ICD-10-CM

## 2017-12-13 DIAGNOSIS — J432 Centrilobular emphysema: Principal | ICD-10-CM

## 2017-12-13 DIAGNOSIS — G2581 Restless legs syndrome: ICD-10-CM

## 2017-12-13 DIAGNOSIS — I2510 Atherosclerotic heart disease of native coronary artery without angina pectoris: ICD-10-CM

## 2017-12-13 DIAGNOSIS — J449 Chronic obstructive pulmonary disease, unspecified: ICD-10-CM

## 2017-12-13 DIAGNOSIS — I119 Hypertensive heart disease without heart failure: ICD-10-CM

## 2017-12-13 DIAGNOSIS — E7800 Pure hypercholesterolemia, unspecified: ICD-10-CM

## 2017-12-13 DIAGNOSIS — I255 Ischemic cardiomyopathy: ICD-10-CM

## 2017-12-13 DIAGNOSIS — J45909 Unspecified asthma, uncomplicated: ICD-10-CM

## 2017-12-13 DIAGNOSIS — Z87891 Personal history of nicotine dependence: ICD-10-CM

## 2017-12-13 DIAGNOSIS — Z91041 Radiographic dye allergy status: ICD-10-CM

## 2017-12-13 DIAGNOSIS — R0600 Dyspnea, unspecified: ICD-10-CM

## 2017-12-13 MED ORDER — ALBUTEROL SULFATE 90 MCG/ACTUATION IN HFAA
2 | RESPIRATORY_TRACT | 0 refills | Status: AC | PRN
Start: 2017-12-13 — End: ?

## 2017-12-13 MED ORDER — ALBUTEROL SULFATE 2.5 MG /3 ML (0.083 %) IN NEBU
2.5 mg | RESPIRATORY_TRACT | 3 refills | Status: AC | PRN
Start: 2017-12-13 — End: 2017-12-13

## 2018-04-12 ENCOUNTER — Encounter: Admit: 2018-04-12 | Discharge: 2018-04-12 | Payer: MEDICARE

## 2018-04-12 NOTE — Telephone Encounter
Received msg on nurse line from male caller asking for call back to preferred chart # re: pt. Upon call back, spoke to pt's spouse who confirmed she was the one who called and was wanting to know if Dr. Danella Maiers [facility]) would prescribe an antibiotic for pt, but they decided to go to ER in [place] instead and pt is currently being evaluated there for breathing problems. Advised Mrs. Siple this sounded like a good plan and to let us know if they needed anything additional. She verbalized understanding and was agreeable to this plan. No further needs identified at this time.

## 2018-04-13 LAB — COMPREHENSIVE METABOLIC PANEL
Lab: 0.7
Lab: 100
Lab: 101
Lab: 105
Lab: 11
Lab: 13
Lab: 138
Lab: 199 — ABNORMAL HIGH (ref 70–105)
Lab: 2.1 — ABNORMAL HIGH (ref 0.2–1.2)
Lab: 24
Lab: 29
Lab: 3.6
Lab: 34
Lab: 7.1
Lab: 8.6 — ABNORMAL LOW (ref 8.8–10.0)

## 2018-04-13 LAB — MAGNESIUM: Lab: 2

## 2018-04-13 LAB — CBC: Lab: 6.6

## 2018-04-16 ENCOUNTER — Encounter: Admit: 2018-04-16 | Discharge: 2018-04-16 | Payer: MEDICARE

## 2018-04-17 ENCOUNTER — Encounter: Admit: 2018-04-17 | Discharge: 2018-04-17 | Payer: MEDICARE

## 2018-06-10 ENCOUNTER — Encounter: Admit: 2018-06-10 | Discharge: 2018-06-10 | Payer: MEDICARE

## 2018-06-10 MED ORDER — ISOSORBIDE MONONITRATE 30 MG PO TB24
ORAL_TABLET | Freq: Every morning | ORAL | 3 refills | 30.00000 days | Status: DC
Start: 2018-06-10 — End: 2019-05-19

## 2018-06-10 MED ORDER — EZETIMIBE 10 MG PO TAB
ORAL_TABLET | Freq: Every day | 3 refills | Status: DC
Start: 2018-06-10 — End: 2019-05-19

## 2018-07-07 ENCOUNTER — Encounter: Admit: 2018-07-07 | Discharge: 2018-07-07 | Payer: MEDICARE

## 2018-07-07 MED ORDER — FUROSEMIDE 40 MG PO TAB
ORAL_TABLET | Freq: Every day | ORAL | 3 refills | 90.00000 days | Status: DC
Start: 2018-07-07 — End: 2019-06-16

## 2018-07-07 MED ORDER — CARVEDILOL 6.25 MG PO TAB
ORAL_TABLET | Freq: Two times a day (BID) | ORAL | 3 refills | 90.00000 days | Status: DC
Start: 2018-07-07 — End: 2019-06-16

## 2018-07-16 ENCOUNTER — Encounter: Admit: 2018-07-16 | Discharge: 2018-07-16

## 2018-07-17 MED ORDER — POTASSIUM CHLORIDE 10 MEQ PO TBER
ORAL_TABLET | Freq: Every day | 3 refills | 30.00000 days | Status: DC
Start: 2018-07-17 — End: 2019-07-08

## 2018-07-18 ENCOUNTER — Encounter: Admit: 2018-07-18 | Discharge: 2018-07-18

## 2018-07-18 ENCOUNTER — Ambulatory Visit: Admit: 2018-07-18 | Discharge: 2018-07-19

## 2018-07-18 DIAGNOSIS — Z87891 Personal history of nicotine dependence: Secondary | ICD-10-CM

## 2018-07-18 DIAGNOSIS — I429 Cardiomyopathy, unspecified: Secondary | ICD-10-CM

## 2018-07-18 DIAGNOSIS — I10 Essential (primary) hypertension: Secondary | ICD-10-CM

## 2018-07-18 DIAGNOSIS — I119 Hypertensive heart disease without heart failure: Secondary | ICD-10-CM

## 2018-07-18 DIAGNOSIS — R000 Tachycardia, unspecified: Secondary | ICD-10-CM

## 2018-07-18 DIAGNOSIS — I2510 Atherosclerotic heart disease of native coronary artery without angina pectoris: Secondary | ICD-10-CM

## 2018-07-18 DIAGNOSIS — I5022 Chronic systolic (congestive) heart failure: Secondary | ICD-10-CM

## 2018-07-18 DIAGNOSIS — E7800 Pure hypercholesterolemia, unspecified: Secondary | ICD-10-CM

## 2018-07-18 DIAGNOSIS — E785 Hyperlipidemia, unspecified: Secondary | ICD-10-CM

## 2018-07-18 DIAGNOSIS — Z951 Presence of aortocoronary bypass graft: Secondary | ICD-10-CM

## 2018-07-18 DIAGNOSIS — Z91041 Radiographic dye allergy status: Secondary | ICD-10-CM

## 2018-07-18 DIAGNOSIS — I255 Ischemic cardiomyopathy: Secondary | ICD-10-CM

## 2018-07-18 DIAGNOSIS — J45909 Unspecified asthma, uncomplicated: Secondary | ICD-10-CM

## 2018-07-18 DIAGNOSIS — J449 Chronic obstructive pulmonary disease, unspecified: Secondary | ICD-10-CM

## 2018-07-18 DIAGNOSIS — G2581 Restless legs syndrome: Secondary | ICD-10-CM

## 2018-07-18 DIAGNOSIS — Z9981 Dependence on supplemental oxygen: Secondary | ICD-10-CM

## 2018-07-18 DIAGNOSIS — J432 Centrilobular emphysema: Secondary | ICD-10-CM

## 2018-07-18 DIAGNOSIS — R0600 Dyspnea, unspecified: Secondary | ICD-10-CM

## 2018-07-18 NOTE — Progress Notes
Date of Service: 07/18/2018    Joseph Dougherty is a 63 y.o. male.       HPI     Joseph Dougherty and Joseph Dougherty were in the [place] clinic today and it's always a pleasure to see them!  Despite Joseph Dougherty's really severe, end-stage COPD they just seem to have a lot of fun with each other!  He's intentionally lost about 20 pounds and really looks better!  He's breathing a little better as well.    He isn't having any angina, palpitations, or light headedness.  He denies any TIA or stroke symptoms.  He hasn't had peripheral edema or claudication.         Vitals:    07/18/18 1414   BP: 102/74   BP Source: Arm, Left Lower   Pulse: 96   SpO2: 92%   Weight: 72.8 kg (160 lb 6.4 oz)   Height: 1.702 m (5' 7)   PainSc: Zero     Body mass index is 25.12 kg/m???.     Past Medical History  Patient Active Problem List    Diagnosis Date Noted   ??? Other chest pain 09/16/2016   ??? Chest pain 09/16/2016   ??? Ischemic cardiomyopathy 05/31/2012   ??? Dependence on supplemental oxygen 05/31/2012   ??? Dyspnea on exertion 05/31/2012   ??? 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 Restless legs syndrome 01/17/2011   ??? Sinus tachycardia 08/25/2008   ??? History of tobacco abuse 08/25/2008   ??? Hypercholesterolemia 08/25/2008   ??? Allergy to radiographic contrast media 01/15/2007   ??? Exertional chest pain 01/14/2007     Abnormal stress thallium     ??? CAD (coronary artery disease) 03/30/2006      9/05 - Inferior MI with thrombolytic, anatomy not suitable for PCI   9/05 - Emergent CABG x 2: SVG-right posterolateral and SVG-ramus.   11/05 - ECOD: LVEF 50%, mild posterior hypokinesis.  (EF 59% per LVgram).                 Status post cardiac rehab participation.   5/06 - Persantine Myoview stress test ([place], [place]):  EF 60%, no ischemia   12/08 - Cardiac cath: mild LV dysfunction with previous inferior wall infarction.                Two vessel CAD with patent vein graft to intermediate vessel and patent                vein graft to RPLV branch. No significant LAD or Cx disease. Allergic reaction to contrast dye with manifestation of hives treated with Benadryl                and Solu-Medrol.  05/2012 - Cath @ [facility].  No significant progression of CAD.  Grafts patent.  EF 45-50%.     ??? COPD (chronic obstructive pulmonary disease) (HCC) 03/30/2006     He has a long smoking history and was first told he had COPD in 2008 and initially saw Dr. Trixie Dougherty. Joseph Dougherty in the fellows clinic.  He has required hospitalization nearly yearly the last 3-4 years.  He thinks he may have had a pneumothorax in the past also.  He was started on nocturnal oxygen late 2008.   He has normal alpha one antitrypsin testing in 2008.   He quit smoking in 1/13.  He has been on Spiriva and Advair for several years but switched to Symbicort in 2014.  Azithromycin started 11/2013  Review of Systems   Constitution: Negative.   HENT: Negative.    Eyes: Negative.    Cardiovascular: Negative.    Respiratory: Negative.    Endocrine: Negative.    Hematologic/Lymphatic: Negative.    Skin: Negative.    Musculoskeletal: Negative.    Gastrointestinal: Negative.    Genitourinary: Negative.    Neurological: Negative.    Psychiatric/Behavioral: Negative.    Allergic/Immunologic: Negative.        Physical Exam    Physical Exam   General Appearance: no distress, using supplemental oxygen via nasal cannula   Skin: warm, no ulcers or xanthomas   Digits and Nails: no cyanosis or clubbing   Eyes: conjunctivae and lids normal, pupils are equal and round   Teeth/Gums/Palate: dentition unremarkable, no lesions   Lips & Oral Mucosa: no pallor or cyanosis   Neck Veins: normal JVP , neck veins are not distended   Thyroid: no nodules, masses, tenderness or enlargement   Chest Inspection: chest is normal in appearance   Respiratory Effort: breathing comfortably, no respiratory distress   Auscultation/Percussion: lungs clear to auscultation, no rales or rhonchi, no wheezing   PMI: PMI not enlarged or displaced Cardiac Rhythm: regular rhythm and normal rate   Cardiac Auscultation: S1, S2 normal, no rub, no gallop   Murmurs: no murmur   Peripheral Circulation: normal peripheral circulation   Carotid Arteries: normal carotid upstroke bilaterally, no bruits   Radial Arteries: normal symmetric radial pulses   Abdominal Aorta: no abdominal aortic bruit   Pedal Pulses: normal symmetric pedal pulses   Lower Extremity Edema: no lower extremity edema   Abdominal Exam: soft, non-tender, no masses, bowel sounds normal   Liver & Spleen: no organomegaly   Gait & Station: walks without assistance   Muscle Strength: normal muscle tone   Orientation: oriented to time, place and person   Affect & Mood: appropriate and sustained affect   Language and Memory: patient responsive and seems to comprehend information   Neurologic Exam: neurological assessment grossly intact   Other: moves all extremities      Problems Addressed Today  Encounter Diagnoses   Name Primary?   ??? Coronary artery disease involving native coronary artery of native heart without angina pectoris    ??? Hypercholesterolemia    ??? Centrilobular emphysema (HCC)        Assessment and Plan       CAD (coronary artery disease)  No angina symptoms.    Hypercholesterolemia  Lab Results   Component Value Date    CHOL 122 08/30/2017    TRIG 240 08/30/2017    HDL 29 08/30/2017    LDL 67 08/30/2017    VLDL 36 05/06/2012    NONHDLCHOL 86 05/06/2012    CHOLHDLC 4 08/30/2017          COPD (chronic obstructive pulmonary disease) (HCC)  He's done well after losing about 20 pounds intentionally.      Current Medications (including today's revisions)  ??? albuterol (VENTOLIN HFA) 90 mcg/actuation inhaler Inhale two puffs by mouth into the lungs every 6 hours as needed for Wheezing or Shortness of Breath. Shake well before use.   ??? aspirin 325 mg tablet Take 325 mg by mouth daily.   ??? atorvastatin (LIPITOR) 40 mg tablet TAKE 1 TABLET DAILY ??? azithromycin (ZITHROMAX) 250 mg tablet 1 Tab three times weekly. Marland Kitchen   ??? budesonide/formoterol (SYMBICORT) 80-4.5 mcg/actuation HFAA inhalation Inhale 2 Puffs by mouth twice daily.   ??? carvediloL (COREG) 6.25 mg  tablet TAKE 1 TABLET TWICE A DAY WITH MEALS   ??? ezetimibe (ZETIA) 10 mg tablet TAKE 1 TABLET DAILY   ??? furosemide (LASIX) 40 mg tablet TAKE 1 TABLET DAILY   ??? isosorbide mononitrate SR (IMDUR) 30 mg tablet TAKE 1 TABLET EVERY MORNING   ??? metFORMIN (GLUCOPHAGE) 500 mg tablet Take 500 mg by mouth daily.   ??? nitroglycerin (NITROSTAT) 0.4 mg tablet 1 Tab every 5 minutes as needed for Chest Pain. FOR CHEST PAIN   ??? potassium chloride (KLOR-CON) 10 mEq tablet TAKE 2 TABLETS DAILY WITH A MEAL AND AND A FULL GLASS OF WATER   ??? sertraline (ZOLOFT) 100 mg tablet Take 100 mg by mouth daily.   ??? temazepam (RESTORIL) 15 mg capsule Take 1 Cap by mouth at bedtime as needed.   ??? tiotropium (SPIRIVA WITH HANDIHALER) 18 mcg inhaler Inhale 18 mcg by mouth Daily.

## 2018-07-20 NOTE — Assessment & Plan Note
No angina symptoms.

## 2018-07-20 NOTE — Assessment & Plan Note
He's done well after losing about 20 pounds intentionally.

## 2018-07-20 NOTE — Assessment & Plan Note
Lab Results   Component Value Date    CHOL 122 08/30/2017    TRIG 240 08/30/2017    HDL 29 08/30/2017    LDL 67 08/30/2017    VLDL 36 05/06/2012    NONHDLCHOL 86 05/06/2012    CHOLHDLC 4 08/30/2017

## 2018-08-04 ENCOUNTER — Encounter: Admit: 2018-08-04 | Discharge: 2018-08-04

## 2018-08-05 MED ORDER — ATORVASTATIN 40 MG PO TAB
ORAL_TABLET | Freq: Every day | 3 refills | Status: DC
Start: 2018-08-05 — End: 2019-07-08

## 2018-11-04 MED ORDER — NITROGLYCERIN 0.4 MG SL SUBL
0.4 mg | ORAL_TABLET | SUBLINGUAL | 1 refills | 9.00000 days | Status: AC | PRN
Start: 2018-11-04 — End: ?

## 2019-03-20 ENCOUNTER — Encounter: Admit: 2019-03-20 | Discharge: 2019-03-20 | Payer: MEDICARE

## 2019-03-20 DIAGNOSIS — I10 Essential (primary) hypertension: Secondary | ICD-10-CM

## 2019-03-20 DIAGNOSIS — I255 Ischemic cardiomyopathy: Secondary | ICD-10-CM

## 2019-03-20 DIAGNOSIS — I2510 Atherosclerotic heart disease of native coronary artery without angina pectoris: Secondary | ICD-10-CM

## 2019-03-20 DIAGNOSIS — Z9981 Dependence on supplemental oxygen: Secondary | ICD-10-CM

## 2019-03-20 DIAGNOSIS — J432 Centrilobular emphysema: Secondary | ICD-10-CM

## 2019-03-20 DIAGNOSIS — Z91041 Radiographic dye allergy status: Secondary | ICD-10-CM

## 2019-03-20 DIAGNOSIS — I119 Hypertensive heart disease without heart failure: Secondary | ICD-10-CM

## 2019-03-20 DIAGNOSIS — Z87891 Personal history of nicotine dependence: Secondary | ICD-10-CM

## 2019-03-20 DIAGNOSIS — R000 Tachycardia, unspecified: Secondary | ICD-10-CM

## 2019-03-20 DIAGNOSIS — J449 Chronic obstructive pulmonary disease, unspecified: Secondary | ICD-10-CM

## 2019-03-20 DIAGNOSIS — R0600 Dyspnea, unspecified: Secondary | ICD-10-CM

## 2019-03-20 DIAGNOSIS — I5022 Chronic systolic (congestive) heart failure: Secondary | ICD-10-CM

## 2019-03-20 DIAGNOSIS — G2581 Restless legs syndrome: Secondary | ICD-10-CM

## 2019-03-20 DIAGNOSIS — I429 Cardiomyopathy, unspecified: Secondary | ICD-10-CM

## 2019-03-20 DIAGNOSIS — E7800 Pure hypercholesterolemia, unspecified: Secondary | ICD-10-CM

## 2019-03-20 DIAGNOSIS — E785 Hyperlipidemia, unspecified: Secondary | ICD-10-CM

## 2019-03-20 DIAGNOSIS — Z951 Presence of aortocoronary bypass graft: Secondary | ICD-10-CM

## 2019-03-20 DIAGNOSIS — J45909 Unspecified asthma, uncomplicated: Secondary | ICD-10-CM

## 2019-03-20 NOTE — Progress Notes
Date of Service: 03/20/2019    Joseph Dougherty is a 64 y.o. male.       HPI     Joseph Dougherty and Joseph Dougherty were in the [place] clinic today for follow-up regarding his coronary disease and COPD.  He was hospitalized in July with pneumonia and a diagnosis of diabetes was established.  Since then he is done fine and seems to have avoided Covid so far.    He has not had any anginal chest discomfort nor has he had any palpitations, syncope, or near syncope.  He denies any TIA or stroke symptoms.  His fluid status is stable and he has not had problems with peripheral edema.         Vitals:    03/20/19 1412   BP: 118/72   BP Source: Arm, Right Upper   Patient Position: Sitting   Pulse: 92   SpO2: 98%   Weight: 74.8 kg (165 lb)   Height: 1.626 m (5' 4)   PainSc: Zero     Body mass index is 28.32 kg/m?Marland Kitchen     Past Medical History  Patient Active Problem List    Diagnosis Date Noted   ? Other chest pain 09/16/2016   ? Chest pain 09/16/2016   ? Ischemic cardiomyopathy 05/31/2012   ? Dependence on supplemental oxygen 05/31/2012   ? Dyspnea on exertion 05/31/2012   ? 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 Restless legs syndrome 01/17/2011   ? Sinus tachycardia 08/25/2008   ? History of tobacco abuse 08/25/2008   ? Hypercholesterolemia 08/25/2008   ? Allergy to radiographic contrast media 01/15/2007   ? Exertional chest pain 01/14/2007     Abnormal stress thallium     ? CAD (coronary artery disease) 03/30/2006      9/05 - Inferior MI with thrombolytic, anatomy not suitable for PCI   9/05 - Emergent CABG x 2: SVG-right posterolateral and SVG-ramus.   11/05 - ECOD: LVEF 50%, mild posterior hypokinesis.  (EF 59% per LVgram).                 Status post cardiac rehab participation.   5/06 - Persantine Myoview stress test ([place], [place]):  EF 60%, no ischemia   12/08 - Cardiac cath: mild LV dysfunction with previous inferior wall infarction.                Two vessel CAD with patent vein graft to intermediate vessel and patent                vein graft to RPLV branch. No significant LAD or Cx disease. Allergic                 reaction to contrast dye with manifestation of hives treated with Benadryl                and Solu-Medrol.  05/2012 - Cath @ [facility].  No significant progression of CAD.  Grafts patent.  EF 45-50%.     ? COPD (chronic obstructive pulmonary disease) (HCC) 03/30/2006     He has a long smoking history and was first told he had COPD in 2008 and initially saw Dr. Trixie Dredge. Earlene Plater in the fellows clinic.  He has required hospitalization nearly yearly the last 3-4 years.  He thinks he may have had a pneumothorax in the past also.  He was started on nocturnal oxygen late 2008.   He has normal alpha one antitrypsin testing in 2008.   He quit smoking  in 1/13.  He has been on Spiriva and Advair for several years but switched to Symbicort in 2014.  Azithromycin started 11/2013             Review of Systems   Constitution: Negative.   HENT: Negative.    Eyes: Negative.    Cardiovascular: Negative.    Respiratory: Negative.    Endocrine: Negative.    Hematologic/Lymphatic: Negative.    Skin: Negative.    Musculoskeletal: Negative.    Gastrointestinal: Negative.    Genitourinary: Negative.    Neurological: Negative.    Psychiatric/Behavioral: Negative.    Allergic/Immunologic: Negative.        Physical Exam    Physical Exam   General Appearance: no distress, using supplemental oxygen   Skin: warm, no ulcers or xanthomas   Digits and Nails: no cyanosis or clubbing   Eyes: conjunctivae and lids normal, pupils are equal and round   Teeth/Gums/Palate: dentition unremarkable, no lesions   Lips & Oral Mucosa: no pallor or cyanosis   Neck Veins: normal JVP , neck veins are not distended   Thyroid: no nodules, masses, tenderness or enlargement   Chest Inspection: chest is normal in appearance   Respiratory Effort: breathing comfortably, no respiratory distress   Auscultation/Percussion: lungs clear to auscultation, decreased breath sounds  PMI: PMI not enlarged or displaced Cardiac Rhythm: regular rhythm and normal rate   Cardiac Auscultation: S1, S2 normal, no rub, no gallop   Murmurs: no murmur   Peripheral Circulation: normal peripheral circulation   Carotid Arteries: normal carotid upstroke bilaterally, no bruits   Radial Arteries: normal symmetric radial pulses   Abdominal Aorta: no abdominal aortic bruit   Pedal Pulses: normal symmetric pedal pulses   Lower Extremity Edema: no lower extremity edema   Abdominal Exam: soft, non-tender, no masses, bowel sounds normal   Liver & Spleen: no organomegaly   Gait & Station: walks without assistance   Muscle Strength: normal muscle tone   Orientation: oriented to time, place and person   Affect & Mood: appropriate and sustained affect   Language and Memory: patient responsive and seems to comprehend information   Neurologic Exam: neurological assessment grossly intact   Other: moves all extremities      Problems Addressed Today  Encounter Diagnoses   Name Primary?   ? Coronary artery disease involving native coronary artery of native heart without angina pectoris    ? Hypercholesterolemia    ? Centrilobular emphysema (HCC)        Assessment and Plan       CAD (coronary artery disease)  No angina or other symptoms to suggest progression of coronary disease.  I have not planned surveillance stress testing.  The last evaluation of his coronary disease was a cardiac cath back in 2014.    Hypercholesterolemia  Lab Results   Component Value Date    CHOL 122 08/30/2017    TRIG 240 08/30/2017    HDL 29 08/30/2017    LDL 67 08/30/2017    VLDL 36 05/06/2012    NONHDLCHOL 86 05/06/2012    CHOLHDLC 4 08/30/2017      LDL treated to goal.    COPD (chronic obstructive pulmonary disease) (HCC)  Stable, oxygen dependent COPD.      Current Medications (including today's revisions)  ? albuterol (VENTOLIN HFA) 90 mcg/actuation inhaler Inhale two puffs by mouth into the lungs every 6 hours as needed for Wheezing or Shortness of Breath. Shake well before use. ? aspirin 325  mg tablet Take 325 mg by mouth daily.   ? atorvastatin (LIPITOR) 40 mg tablet TAKE 1 TABLET DAILY   ? azithromycin (ZITHROMAX) 250 mg tablet 1 Tab three times weekly. .   ? budesonide/formoterol (SYMBICORT) 80-4.5 mcg/actuation HFAA inhalation Inhale 2 Puffs by mouth twice daily.   ? carvediloL (COREG) 6.25 mg tablet TAKE 1 TABLET TWICE A DAY WITH MEALS   ? ezetimibe (ZETIA) 10 mg tablet TAKE 1 TABLET DAILY   ? furosemide (LASIX) 40 mg tablet TAKE 1 TABLET DAILY   ? isosorbide mononitrate SR (IMDUR) 30 mg tablet TAKE 1 TABLET EVERY MORNING   ? nitroglycerin (NITROSTAT) 0.4 mg tablet Place one tablet under tongue every 5 minutes as needed for Chest Pain. FOR CHEST PAIN   ? potassium chloride (KLOR-CON) 10 mEq tablet TAKE 2 TABLETS DAILY WITH A MEAL AND AND A FULL GLASS OF WATER   ? sertraline (ZOLOFT) 100 mg tablet Take 100 mg by mouth daily.   ? sertraline (ZOLOFT) 50 mg tablet Take 50 mg by mouth daily. take with 100mg  tab  to = 150mg    ? temazepam (RESTORIL) 15 mg capsule Take 1 Cap by mouth at bedtime as needed.   ? tiotropium (SPIRIVA WITH HANDIHALER) 18 mcg inhaler Inhale 18 mcg by mouth Daily.     Total time spent on today's office visit was 30 minutes.  This includes face-to-face in person visit with patient as well as nonface-to-face time including review of the EMR, outside records, labs, radiologic studies, echocardiogram & other cardiovascular studies, formation of treatment plan, after visit summary, future disposition, and lastly on documentation.

## 2019-03-20 NOTE — Assessment & Plan Note
No angina or other symptoms to suggest progression of coronary disease.  I have not planned surveillance stress testing.  The last evaluation of his coronary disease was a cardiac cath back in 2014.

## 2019-03-20 NOTE — Assessment & Plan Note
Lab Results   Component Value Date    CHOL 122 08/30/2017    TRIG 240 08/30/2017    HDL 29 08/30/2017    LDL 67 08/30/2017    VLDL 36 05/06/2012    NONHDLCHOL 86 05/06/2012    CHOLHDLC 4 08/30/2017      LDL treated to goal.

## 2019-03-20 NOTE — Assessment & Plan Note
Stable, oxygen dependent COPD.

## 2019-05-18 ENCOUNTER — Encounter: Admit: 2019-05-18 | Discharge: 2019-05-18 | Payer: MEDICARE

## 2019-05-19 MED ORDER — EZETIMIBE 10 MG PO TAB
ORAL_TABLET | Freq: Every day | 3 refills | Status: AC
Start: 2019-05-19 — End: ?

## 2019-05-19 MED ORDER — ISOSORBIDE MONONITRATE 30 MG PO TB24
ORAL_TABLET | Freq: Every morning | ORAL | 3 refills | 90.00000 days | Status: AC
Start: 2019-05-19 — End: ?

## 2019-06-15 ENCOUNTER — Encounter: Admit: 2019-06-15 | Discharge: 2019-06-15 | Payer: MEDICARE

## 2019-06-16 MED ORDER — FUROSEMIDE 40 MG PO TAB
ORAL_TABLET | Freq: Every day | ORAL | 3 refills | 90.00000 days | Status: AC
Start: 2019-06-16 — End: ?

## 2019-06-16 MED ORDER — CARVEDILOL 6.25 MG PO TAB
ORAL_TABLET | Freq: Two times a day (BID) | ORAL | 3 refills | 90.00000 days | Status: AC
Start: 2019-06-16 — End: ?

## 2019-07-08 ENCOUNTER — Encounter: Admit: 2019-07-08 | Discharge: 2019-07-08 | Payer: MEDICARE

## 2019-07-08 MED ORDER — ATORVASTATIN 40 MG PO TAB
40 mg | ORAL_TABLET | Freq: Every day | ORAL | 3 refills | Status: AC
Start: 2019-07-08 — End: ?

## 2019-07-08 MED ORDER — POTASSIUM CHLORIDE 10 MEQ PO TBER
20 meq | ORAL_TABLET | Freq: Every day | ORAL | 3 refills | 30.00000 days | Status: AC
Start: 2019-07-08 — End: ?

## 2019-11-06 ENCOUNTER — Encounter: Admit: 2019-11-06 | Discharge: 2019-11-06 | Payer: MEDICARE

## 2019-11-06 DIAGNOSIS — Z951 Presence of aortocoronary bypass graft: Secondary | ICD-10-CM

## 2019-11-06 DIAGNOSIS — J45909 Unspecified asthma, uncomplicated: Secondary | ICD-10-CM

## 2019-11-06 DIAGNOSIS — I255 Ischemic cardiomyopathy: Secondary | ICD-10-CM

## 2019-11-06 DIAGNOSIS — I5022 Chronic systolic (congestive) heart failure: Secondary | ICD-10-CM

## 2019-11-06 DIAGNOSIS — Z87891 Personal history of nicotine dependence: Secondary | ICD-10-CM

## 2019-11-06 DIAGNOSIS — E785 Hyperlipidemia, unspecified: Secondary | ICD-10-CM

## 2019-11-06 DIAGNOSIS — I429 Cardiomyopathy, unspecified: Secondary | ICD-10-CM

## 2019-11-06 DIAGNOSIS — E7800 Pure hypercholesterolemia, unspecified: Secondary | ICD-10-CM

## 2019-11-06 DIAGNOSIS — Z91041 Radiographic dye allergy status: Secondary | ICD-10-CM

## 2019-11-06 DIAGNOSIS — R0600 Dyspnea, unspecified: Secondary | ICD-10-CM

## 2019-11-06 DIAGNOSIS — G2581 Restless legs syndrome: Secondary | ICD-10-CM

## 2019-11-06 DIAGNOSIS — I10 Essential (primary) hypertension: Secondary | ICD-10-CM

## 2019-11-06 DIAGNOSIS — I2510 Atherosclerotic heart disease of native coronary artery without angina pectoris: Principal | ICD-10-CM

## 2019-11-06 DIAGNOSIS — Z9981 Dependence on supplemental oxygen: Secondary | ICD-10-CM

## 2019-11-06 DIAGNOSIS — R000 Tachycardia, unspecified: Secondary | ICD-10-CM

## 2019-11-06 DIAGNOSIS — I119 Hypertensive heart disease without heart failure: Secondary | ICD-10-CM

## 2019-11-06 DIAGNOSIS — J449 Chronic obstructive pulmonary disease, unspecified: Secondary | ICD-10-CM

## 2020-01-20 ENCOUNTER — Encounter: Admit: 2020-01-20 | Discharge: 2020-01-20 | Payer: MEDICARE

## 2020-02-24 ENCOUNTER — Encounter: Admit: 2020-02-24 | Discharge: 2020-02-24 | Payer: MEDICARE

## 2020-06-04 ENCOUNTER — Encounter: Admit: 2020-06-04 | Discharge: 2020-06-04 | Payer: MEDICARE

## 2020-06-04 MED ORDER — EZETIMIBE 10 MG PO TAB
10 mg | ORAL_TABLET | Freq: Every day | ORAL | 3 refills | Status: AC
Start: 2020-06-04 — End: ?

## 2020-06-04 MED ORDER — ISOSORBIDE MONONITRATE 30 MG PO TB24
30 mg | ORAL_TABLET | Freq: Every morning | ORAL | 3 refills | 30.00000 days | Status: AC
Start: 2020-06-04 — End: ?

## 2020-06-17 ENCOUNTER — Encounter: Admit: 2020-06-17 | Discharge: 2020-06-17 | Payer: MEDICARE

## 2020-06-17 DIAGNOSIS — R000 Tachycardia, unspecified: Secondary | ICD-10-CM

## 2020-06-17 DIAGNOSIS — R0600 Dyspnea, unspecified: Secondary | ICD-10-CM

## 2020-06-17 DIAGNOSIS — I5022 Chronic systolic (congestive) heart failure: Secondary | ICD-10-CM

## 2020-06-17 DIAGNOSIS — I2510 Atherosclerotic heart disease of native coronary artery without angina pectoris: Secondary | ICD-10-CM

## 2020-06-17 DIAGNOSIS — E7800 Pure hypercholesterolemia, unspecified: Secondary | ICD-10-CM

## 2020-06-17 DIAGNOSIS — Z9981 Dependence on supplemental oxygen: Secondary | ICD-10-CM

## 2020-06-17 DIAGNOSIS — J45909 Unspecified asthma, uncomplicated: Secondary | ICD-10-CM

## 2020-06-17 DIAGNOSIS — I119 Hypertensive heart disease without heart failure: Secondary | ICD-10-CM

## 2020-06-17 DIAGNOSIS — J449 Chronic obstructive pulmonary disease, unspecified: Secondary | ICD-10-CM

## 2020-06-17 DIAGNOSIS — E785 Hyperlipidemia, unspecified: Secondary | ICD-10-CM

## 2020-06-17 DIAGNOSIS — I429 Cardiomyopathy, unspecified: Secondary | ICD-10-CM

## 2020-06-17 DIAGNOSIS — Z91041 Radiographic dye allergy status: Secondary | ICD-10-CM

## 2020-06-17 DIAGNOSIS — J432 Centrilobular emphysema: Secondary | ICD-10-CM

## 2020-06-17 DIAGNOSIS — I10 Essential (primary) hypertension: Secondary | ICD-10-CM

## 2020-06-17 DIAGNOSIS — Z951 Presence of aortocoronary bypass graft: Secondary | ICD-10-CM

## 2020-06-17 DIAGNOSIS — I255 Ischemic cardiomyopathy: Secondary | ICD-10-CM

## 2020-06-17 DIAGNOSIS — G2581 Restless legs syndrome: Secondary | ICD-10-CM

## 2020-06-17 DIAGNOSIS — Z87891 Personal history of nicotine dependence: Secondary | ICD-10-CM

## 2020-06-17 MED ORDER — FUROSEMIDE 40 MG PO TAB
40 mg | ORAL_TABLET | Freq: Every day | ORAL | 3 refills | 90.00000 days | Status: AC
Start: 2020-06-17 — End: ?

## 2020-06-17 MED ORDER — CARVEDILOL 6.25 MG PO TAB
6.25 mg | ORAL_TABLET | Freq: Two times a day (BID) | ORAL | 3 refills | 90.00000 days | Status: AC
Start: 2020-06-17 — End: ?

## 2020-06-17 NOTE — Assessment & Plan Note
He has quite severe, oxygen dependent COPD but did well through this past winter.

## 2020-06-17 NOTE — Assessment & Plan Note
Lab Results   Component Value Date    CHOL 118 11/05/2019    TRIG 142 11/05/2019    HDL 28 (L) 11/05/2019    LDL 62 11/05/2019    VLDL 28 11/05/2019    NONHDLCHOL 86 05/06/2012    CHOLHDLC 4 11/05/2019      LDL treated to goal.

## 2020-06-17 NOTE — Assessment & Plan Note
No angina symptoms.  I am definitely not planning on any sort of surveillance stress testing in his situation.

## 2020-06-17 NOTE — Progress Notes
Date of Service: 06/17/2020    Joseph Dougherty is a 65 y.o. male.       HPI     [name] and [name] were in the [facility] clinic today for follow-up regarding coronary disease.  [name] has done pretty well over the last 6 months.  His COPD remains pretty limiting but they get out and do a remarkable job of living their lives!.  They are taking their camper over to the [facility] for a family camping trip in a couple of weeks.  They are planning a trip up to [place] later this summer to visit their family there.    He is not having any angina nor is he having problems with palpitations, syncope, or near syncope.  He denies any TIA or stroke symptoms.         Vitals:    06/17/20 1420 06/17/20 1431   BP: 122/78 112/76   BP Source: Arm, Left Upper Arm, Right Upper   Pulse: 106    SpO2: 92%    O2 Device: Nasal cannula    O2 Liter Flow: 3 Lpm    PainSc: Zero    Weight: 73.4 kg (161 lb 12.8 oz)    Height: 162.6 cm (5' 4)      Body mass index is 27.77 kg/m?Marland Kitchen     Past Medical History  Patient Active Problem List    Diagnosis Date Noted   ? Other chest pain 09/16/2016   ? Chest pain 09/16/2016   ? Ischemic cardiomyopathy 05/31/2012   ? Dependence on supplemental oxygen 05/31/2012   ? Dyspnea on exertion 05/31/2012   ? 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 Restless legs syndrome 01/17/2011   ? Sinus tachycardia 08/25/2008   ? History of tobacco abuse 08/25/2008   ? Hypercholesterolemia 08/25/2008   ? Allergy to radiographic contrast media 01/15/2007   ? Exertional chest pain 01/14/2007     Abnormal stress thallium     ? CAD (coronary artery disease) 03/30/2006      9/05 - Inferior MI with thrombolytic, anatomy not suitable for PCI   9/05 - Emergent CABG x 2: SVG-right posterolateral and SVG-ramus.   11/05 - ECOD: LVEF 50%, mild posterior hypokinesis.  (EF 59% per LVgram).                 Status post cardiac rehab participation.   5/06 - Persantine Myoview stress test ([place], [place]):  EF 60%, no ischemia   12/08 - Cardiac cath: mild LV dysfunction with previous inferior wall infarction.                Two vessel CAD with patent vein graft to intermediate vessel and patent                vein graft to RPLV branch. No significant LAD or Cx disease. Allergic                 reaction to contrast dye with manifestation of hives treated with Benadryl                and Solu-Medrol.  05/2012 - Cath @ [place].  No significant progression of CAD.  Grafts patent.  EF 45-50%.     ? COPD (chronic obstructive pulmonary disease) (HCC) 03/30/2006     He has a long smoking history and was first told he had COPD in 2008 and initially saw Dr. Trixie Dredge. Earlene Plater in the fellows clinic.  He has required hospitalization  nearly yearly the last 3-4 years.  He thinks he may have had a pneumothorax in the past also.  He was started on nocturnal oxygen late 2008.   He has normal alpha one antitrypsin testing in 2008.   He quit smoking in 1/13.  He has been on Spiriva and Advair for several years but switched to Symbicort in 2014.  Azithromycin started 11/2013             Review of Systems   Constitutional: Negative.   HENT: Negative.    Eyes: Negative.    Cardiovascular: Positive for dyspnea on exertion.   Respiratory: Positive for shortness of breath.    Endocrine: Negative.    Hematologic/Lymphatic: Negative.    Skin: Negative.    Musculoskeletal: Negative.    Gastrointestinal: Negative.    Genitourinary: Negative.    Neurological: Negative.    Psychiatric/Behavioral: Negative.    Allergic/Immunologic: Negative.        Physical Exam    Physical Exam   General Appearance: no distress   Skin: warm, no ulcers or xanthomas   Digits and Nails: no cyanosis or clubbing   Eyes: conjunctivae and lids normal, pupils are equal and round   Teeth/Gums/Palate: dentition unremarkable, no lesions   Lips & Oral Mucosa: no pallor or cyanosis   Neck Veins: normal JVP , neck veins are not distended   Thyroid: no nodules, masses, tenderness or enlargement   Chest Inspection: chest is normal in appearance   Respiratory Effort: breathing comfortably, no respiratory distress   Auscultation/Percussion: lungs clear to auscultation, no rales or rhonchi, no wheezing   PMI: PMI not enlarged or displaced   Cardiac Rhythm: regular rhythm and normal rate   Cardiac Auscultation: S1, S2 normal, no rub, no gallop   Murmurs: no murmur   Peripheral Circulation: normal peripheral circulation   Carotid Arteries: normal carotid upstroke bilaterally, no bruits   Radial Arteries: normal symmetric radial pulses   Abdominal Aorta: no abdominal aortic bruit   Pedal Pulses: normal symmetric pedal pulses   Lower Extremity Edema: no lower extremity edema   Abdominal Exam: soft, non-tender, no masses, bowel sounds normal   Liver & Spleen: no organomegaly   Gait & Station: walks without assistance   Muscle Strength: normal muscle tone   Orientation: oriented to time, place and person   Affect & Mood: appropriate and sustained affect   Language and Memory: patient responsive and seems to comprehend information   Neurologic Exam: neurological assessment grossly intact   Other: moves all extremities      Cardiovascular Health Factors  Vitals BP Readings from Last 3 Encounters:   06/17/20 112/76   11/06/19 102/66   03/20/19 118/72     Wt Readings from Last 3 Encounters:   06/17/20 73.4 kg (161 lb 12.8 oz)   11/06/19 71.1 kg (156 lb 12.8 oz)   03/20/19 74.8 kg (165 lb)     BMI Readings from Last 3 Encounters:   06/17/20 27.77 kg/m?   11/06/19 26.91 kg/m?   03/20/19 28.32 kg/m?      Smoking Social History     Tobacco Use   Smoking Status Former Smoker   ? Packs/day: 0.50   ? Years: 41.00   ? Pack years: 20.50   ? Quit date: 02/22/2011   ? Years since quitting: 9.3   Smokeless Tobacco Never Used   Tobacco Comment    PREVIOUSLY 3PPD; AFTER MI, 1/2 PPD      Lipid Profile Cholesterol  Date Value Ref Range Status   11/05/2019 118  Final     HDL   Date Value Ref Range Status   11/05/2019 28 (L) >40 Final     LDL   Date Value Ref Range Status 11/05/2019 62  Final     Triglycerides   Date Value Ref Range Status   11/05/2019 142  Final      Blood Sugar Hemoglobin A1C   Date Value Ref Range Status   11/05/2019 7.7  Final     Glucose   Date Value Ref Range Status   11/05/2019 107 (H) 70 - 105 Final   04/13/2018 199 (H) 70 - 105 Final   08/30/2017 138 (H)  Final          Problems Addressed Today  Encounter Diagnoses   Name Primary?   ? Hypercholesterolemia    ? Coronary artery disease involving native coronary artery of native heart without angina pectoris    ? Centrilobular emphysema (HCC)        Assessment and Plan       Hypercholesterolemia  Lab Results   Component Value Date    CHOL 118 11/05/2019    TRIG 142 11/05/2019    HDL 28 (L) 11/05/2019    LDL 62 11/05/2019    VLDL 28 11/05/2019    NONHDLCHOL 86 05/06/2012    CHOLHDLC 4 11/05/2019      LDL treated to goal.    CAD (coronary artery disease)  No angina symptoms.  I am definitely not planning on any sort of surveillance stress testing in his situation.    COPD (chronic obstructive pulmonary disease) (HCC)  He has quite severe, oxygen dependent COPD but did well through this past winter.      Current Medications (including today's revisions)  ? albuterol (VENTOLIN HFA) 90 mcg/actuation inhaler Inhale two puffs by mouth into the lungs every 6 hours as needed for Wheezing or Shortness of Breath. Shake well before use.   ? albuterol-ipratropium (DUONEB) 0.5 mg-3 mg(2.5 mg base)/3 mL nebulizer solution Inhale 1 vial solution by nebulizer as directed as Needed.   ? aspirin 325 mg tablet Take 325 mg by mouth daily.   ? atorvastatin (LIPITOR) 40 mg tablet Take one tablet by mouth daily.   ? azithromycin (ZITHROMAX) 250 mg tablet 1 Tab three times weekly. .   ? budesonide-formoterol HFA (SYMBICORT) 80-4.5 mcg/actuation aerosol inhaler Inhale 2 Puffs by mouth twice daily.   ? carvediloL (COREG) 6.25 mg tablet Take one tablet by mouth twice daily with meals. Take with food.   ? ezetimibe (ZETIA) 10 mg tablet Take one tablet by mouth daily.   ? furosemide (LASIX) 40 mg tablet Take one tablet by mouth daily.   ? isosorbide mononitrate ER (IMDUR) 30 mg tablet Take one tablet by mouth every morning.   ? nitroglycerin (NITROSTAT) 0.4 mg tablet Place one tablet under tongue every 5 minutes as needed for Chest Pain. FOR CHEST PAIN   ? potassium chloride (KLOR-CON) 10 mEq tablet Take two tablets by mouth daily. Take with a meal and a full glass of water.   ? sertraline (ZOLOFT) 100 mg tablet Take 100 mg by mouth daily.   ? sertraline (ZOLOFT) 50 mg tablet Take 50 mg by mouth daily. take with 100mg  tab  to = 150mg    ? temazepam (RESTORIL) 15 mg capsule Take 1 Cap by mouth at bedtime as needed.   ? tiotropium bromide (SPIRIVA) 18 mcg capsule for inhaler Inhale 18  mcg by mouth Daily.     Total time spent on today's office visit was 25 minutes.  This includes face-to-face in person visit with patient as well as nonface-to-face time including review of the EMR, outside records, labs, radiologic studies, echocardiogram & other cardiovascular studies, formation of treatment plan, after visit summary, future disposition, and lastly on documentation.

## 2020-11-25 ENCOUNTER — Encounter: Admit: 2020-11-25 | Discharge: 2020-11-25 | Payer: MEDICARE

## 2020-12-16 ENCOUNTER — Encounter: Admit: 2020-12-16 | Discharge: 2020-12-16 | Payer: MEDICARE

## 2020-12-16 ENCOUNTER — Ambulatory Visit: Admit: 2020-12-16 | Discharge: 2020-12-17 | Payer: MEDICARE

## 2020-12-16 DIAGNOSIS — Z87891 Personal history of nicotine dependence: Secondary | ICD-10-CM

## 2020-12-16 DIAGNOSIS — E7800 Pure hypercholesterolemia, unspecified: Secondary | ICD-10-CM

## 2020-12-16 DIAGNOSIS — Z951 Presence of aortocoronary bypass graft: Secondary | ICD-10-CM

## 2020-12-16 DIAGNOSIS — I2510 Atherosclerotic heart disease of native coronary artery without angina pectoris: Secondary | ICD-10-CM

## 2020-12-16 DIAGNOSIS — J432 Centrilobular emphysema: Secondary | ICD-10-CM

## 2020-12-16 DIAGNOSIS — R0609 Other forms of dyspnea: Secondary | ICD-10-CM

## 2020-12-16 DIAGNOSIS — Z91041 Radiographic dye allergy status: Secondary | ICD-10-CM

## 2020-12-16 DIAGNOSIS — I5022 Chronic systolic (congestive) heart failure: Secondary | ICD-10-CM

## 2020-12-16 DIAGNOSIS — J45909 Unspecified asthma, uncomplicated: Secondary | ICD-10-CM

## 2020-12-16 DIAGNOSIS — G2581 Restless legs syndrome: Secondary | ICD-10-CM

## 2020-12-16 DIAGNOSIS — I119 Hypertensive heart disease without heart failure: Secondary | ICD-10-CM

## 2020-12-16 DIAGNOSIS — R000 Tachycardia, unspecified: Secondary | ICD-10-CM

## 2020-12-16 DIAGNOSIS — E785 Hyperlipidemia, unspecified: Secondary | ICD-10-CM

## 2020-12-16 DIAGNOSIS — J449 Chronic obstructive pulmonary disease, unspecified: Secondary | ICD-10-CM

## 2020-12-16 DIAGNOSIS — I255 Ischemic cardiomyopathy: Secondary | ICD-10-CM

## 2020-12-16 DIAGNOSIS — I429 Cardiomyopathy, unspecified: Secondary | ICD-10-CM

## 2020-12-16 DIAGNOSIS — I10 Essential (primary) hypertension: Secondary | ICD-10-CM

## 2020-12-16 DIAGNOSIS — Z9981 Dependence on supplemental oxygen: Secondary | ICD-10-CM

## 2020-12-16 NOTE — Assessment & Plan Note
He has been fortunate not to have any infectious or other exacerbations of his lung disease over the past 6 months.

## 2020-12-16 NOTE — Progress Notes
Date of Service: 12/16/2020    Joseph Dougherty is a 65 y.o. male.       HPI     [name] and [name] were in the [facility] clinic today for follow-up regarding coronary disease.  [name] has done pretty well over the last 6 months.  His COPD remains pretty limiting but they get out and do a remarkable job of living their lives!.  They took their camper over to the [facility] for a family camping trip in June.  They rented a farm house up in [place] for a week this summer to join her extended family there.  ?  He is not having any angina nor is he having problems with palpitations, syncope, or near syncope.  He denies any TIA or stroke symptoms.         Vitals:    12/16/20 1245   BP: 124/72   BP Source: Arm, Left Upper   Pulse: 99   SpO2: 97%   O2 Device: Nasal cannula   O2 Liter Flow: 3 Lpm   PainSc: Zero   Weight: 74.2 kg (163 lb 9.6 oz)   Height: 162.6 cm (5' 4)     Body mass index is 28.08 kg/m?Marland Kitchen     Past Medical History  Patient Active Problem List    Diagnosis Date Noted   ? Other chest pain 09/16/2016   ? Chest pain 09/16/2016   ? Ischemic cardiomyopathy 05/31/2012   ? Dependence on supplemental oxygen 05/31/2012   ? Dyspnea on exertion 05/31/2012   ? 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 Restless legs syndrome 01/17/2011   ? Sinus tachycardia 08/25/2008   ? History of tobacco abuse 08/25/2008   ? Hypercholesterolemia 08/25/2008   ? Allergy to radiographic contrast media 01/15/2007   ? Exertional chest pain 01/14/2007     Abnormal stress thallium     ? CAD (coronary artery disease) 03/30/2006      9/05 - Inferior MI with thrombolytic, anatomy not suitable for PCI   9/05 - Emergent CABG x 2: SVG-right posterolateral and SVG-ramus.   11/05 - ECOD: LVEF 50%, mild posterior hypokinesis.  (EF 59% per LVgram).                 Status post cardiac rehab participation.   5/06 - Persantine Myoview stress test ([place], [place]):  EF 60%, no ischemia   12/08 - Cardiac cath: mild LV dysfunction with previous inferior wall infarction.                Two vessel CAD with patent vein graft to intermediate vessel and patent                vein graft to RPLV branch. No significant LAD or Cx disease. Allergic                 reaction to contrast dye with manifestation of hives treated with Benadryl                and Solu-Medrol.  05/2012 - Cath @ [facility].  No significant progression of CAD.  Grafts patent.  EF 45-50%.     ? COPD (chronic obstructive pulmonary disease) (HCC) 03/30/2006     He has a long smoking history and was first told he had COPD in 2008 and initially saw Dr. Trixie Dredge. Earlene Plater in the fellows clinic.  He has required hospitalization nearly yearly the last 3-4 years.  He thinks he may have had a  pneumothorax in the past also.  He was started on nocturnal oxygen late 2008.   He has normal alpha one antitrypsin testing in 2008.   He quit smoking in 1/13.  He has been on Spiriva and Advair for several years but switched to Symbicort in 2014.  Azithromycin started 11/2013             Review of Systems   Constitutional: Negative.   HENT: Negative.    Eyes: Negative.    Cardiovascular: Negative.    Respiratory: Negative.    Endocrine: Negative.    Hematologic/Lymphatic: Negative.    Skin: Negative.    Musculoskeletal: Negative.    Gastrointestinal: Negative.    Genitourinary: Negative.    Neurological: Negative.    Psychiatric/Behavioral: Negative.    Allergic/Immunologic: Negative.        Physical Exam    Physical Exam   General Appearance: no distress   Skin: warm, no ulcers or xanthomas   Digits and Nails: no cyanosis or clubbing   Eyes: conjunctivae and lids normal, pupils are equal and round   Teeth/Gums/Palate: dentition unremarkable, no lesions   Lips & Oral Mucosa: no pallor or cyanosis   Neck Veins: normal JVP , neck veins are not distended   Thyroid: no nodules, masses, tenderness or enlargement   Chest Inspection: chest is normal in appearance   Respiratory Effort: breathing comfortably, using supplemental oxygen via nasal cannula  Auscultation/Percussion: lungs clear to auscultation, no rales or rhonchi, no wheezing   PMI: PMI not enlarged or displaced   Cardiac Rhythm: regular rhythm and normal rate   Cardiac Auscultation: S1, S2 normal, no rub, no gallop   Murmurs: no murmur   Peripheral Circulation: normal peripheral circulation   Carotid Arteries: normal carotid upstroke bilaterally, no bruits   Radial Arteries: normal symmetric radial pulses   Abdominal Aorta: no abdominal aortic bruit   Pedal Pulses: normal symmetric pedal pulses   Lower Extremity Edema: no lower extremity edema   Abdominal Exam: soft, non-tender, no masses, bowel sounds normal   Liver & Spleen: no organomegaly   Gait & Station: walks without assistance   Muscle Strength: normal muscle tone   Orientation: oriented to time, place and person   Affect & Mood: appropriate and sustained affect   Language and Memory: patient responsive and seems to comprehend information   Neurologic Exam: neurological assessment grossly intact   Other: moves all extremities      Cardiovascular Health Factors  Vitals BP Readings from Last 3 Encounters:   12/16/20 124/72   06/17/20 112/76   11/06/19 102/66     Wt Readings from Last 3 Encounters:   12/16/20 74.2 kg (163 lb 9.6 oz)   06/17/20 73.4 kg (161 lb 12.8 oz)   11/06/19 71.1 kg (156 lb 12.8 oz)     BMI Readings from Last 3 Encounters:   12/16/20 28.08 kg/m?   06/17/20 27.77 kg/m?   11/06/19 26.91 kg/m?      Smoking Social History     Tobacco Use   Smoking Status Former Smoker   ? Packs/day: 0.50   ? Years: 41.00   ? Pack years: 20.50   ? Quit date: 02/22/2011   ? Years since quitting: 9.8   Smokeless Tobacco Never Used   Tobacco Comment    PREVIOUSLY 3PPD; AFTER MI, 1/2 PPD      Lipid Profile Cholesterol   Date Value Ref Range Status   11/24/2020 132  Final     HDL  Date Value Ref Range Status   11/24/2020 33 (L) >-40 Final     LDL   Date Value Ref Range Status   11/24/2020 62  Final     Triglycerides   Date Value Ref Range Status   11/24/2020 189 (H) <150 Final      Blood Sugar Hemoglobin A1C   Date Value Ref Range Status   11/24/2020 5.9  Final     Glucose   Date Value Ref Range Status   11/24/2020 221 (H) 70 - 105 Final   11/05/2019 107 (H) 70 - 105 Final   04/13/2018 199 (H) 70 - 105 Final          Problems Addressed Today  Encounter Diagnoses   Name Primary?   ? Coronary artery disease involving native coronary artery of native heart without angina pectoris    ? Hypercholesterolemia    ? Centrilobular emphysema (HCC)        Assessment and Plan       CAD (coronary artery disease)  No angina symptoms and as long as this is the case I am not planning any sort of surveillance stress testing, given the severity of his lung disease.    Hypercholesterolemia  Lab Results   Component Value Date    CHOL 132 11/24/2020    TRIG 189 (H) 11/24/2020    HDL 33 (L) 11/24/2020    LDL 62 11/24/2020    VLDL 38 11/24/2020    NONHDLCHOL 86 05/06/2012    CHOLHDLC 4 11/24/2020      LDL treated to goal.      COPD (chronic obstructive pulmonary disease) (HCC)  He has been fortunate not to have any infectious or other exacerbations of his lung disease over the past 6 months.      Current Medications (including today's revisions)  ? albuterol (VENTOLIN HFA) 90 mcg/actuation inhaler Inhale two puffs by mouth into the lungs every 6 hours as needed for Wheezing or Shortness of Breath. Shake well before use.   ? albuterol-ipratropium (DUONEB) 0.5 mg-3 mg(2.5 mg base)/3 mL nebulizer solution Inhale 1 vial solution by nebulizer as directed as Needed.   ? aspirin 325 mg tablet Take 325 mg by mouth daily.   ? atorvastatin (LIPITOR) 40 mg tablet Take one tablet by mouth daily.   ? azithromycin (ZITHROMAX) 250 mg tablet 1 Tab three times weekly. .   ? budesonide-formoterol HFA (SYMBICORT) 80-4.5 mcg/actuation aerosol inhaler Inhale 2 Puffs by mouth twice daily.   ? buPROPion XL (WELLBUTRIN XL) 150 mg tablet    ? carvediloL (COREG) 6.25 mg tablet Take one tablet by mouth twice daily with meals. Take with food.   ? ezetimibe (ZETIA) 10 mg tablet Take one tablet by mouth daily.   ? furosemide (LASIX) 40 mg tablet Take one tablet by mouth daily.   ? isosorbide mononitrate ER (IMDUR) 30 mg tablet Take one tablet by mouth every morning.   ? nitroglycerin (NITROSTAT) 0.4 mg tablet Place one tablet under tongue every 5 minutes as needed for Chest Pain. FOR CHEST PAIN   ? potassium chloride (KLOR-CON 10) 10 mEq tablet Take two tablets by mouth daily. Take with a meal and a full glass of water.   ? temazepam (RESTORIL) 15 mg capsule Take 1 Cap by mouth at bedtime as needed.   ? tiotropium bromide (SPIRIVA) 18 mcg capsule for inhaler Inhale 18 mcg by mouth Daily.     Total time spent on today's office visit was 30 minutes.  This includes face-to-face in person visit with patient as well as nonface-to-face time including review of the EMR, outside records, labs, radiologic studies, echocardiogram & other cardiovascular studies, formation of treatment plan, after visit summary, future disposition, and lastly on documentation.

## 2020-12-16 NOTE — Assessment & Plan Note
Lab Results   Component Value Date    CHOL 132 11/24/2020    TRIG 189 (H) 11/24/2020    HDL 33 (L) 11/24/2020    LDL 62 11/24/2020    VLDL 38 11/24/2020    NONHDLCHOL 86 05/06/2012    CHOLHDLC 4 11/24/2020      LDL treated to goal.

## 2020-12-16 NOTE — Assessment & Plan Note
No angina symptoms and as long as this is the case I am not planning any sort of surveillance stress testing, given the severity of his lung disease.

## 2021-01-10 ENCOUNTER — Inpatient Hospital Stay: Admit: 2021-01-10 | Payer: MEDICARE

## 2021-01-10 ENCOUNTER — Inpatient Hospital Stay: Admit: 2021-01-10 | Discharge: 2021-01-10 | Payer: MEDICARE

## 2021-01-10 ENCOUNTER — Encounter: Admit: 2021-01-10 | Discharge: 2021-01-10 | Payer: MEDICARE

## 2021-01-10 DIAGNOSIS — J9621 Acute and chronic respiratory failure with hypoxia: Secondary | ICD-10-CM

## 2021-01-10 LAB — BLOOD GASES, ARTERIAL
BASE EXCESS-ART: 0.4 MMOL/L
BICARB, ART(CAL): 24 MMOL/L (ref 21–28)
O2 SAT,ART(CALC.): 97 % (ref 95–99)
PCO2-ART: 56 mmHg — ABNORMAL HIGH (ref 35–45)
PH-ART: 7.3 MMOL/L — ABNORMAL LOW (ref 7.35–7.45)
PH-ART: 7.3 — ABNORMAL LOW (ref 7.35–7.45)
PO2-ART: 98 mmHg (ref 80–100)

## 2021-01-10 LAB — RVP VIRAL PANEL PCR: SARS-COV-2: DETECTED — AB

## 2021-01-10 LAB — URINALYSIS DIPSTICK REFLEX TO CULTURE
LEUKOCYTES: NEGATIVE K/UL (ref 0–0.20)
NITRITE: NEGATIVE K/UL (ref 0–0.45)
URINE ASCORBIC ACID, UA: NEGATIVE
URINE BILE: NEGATIVE U/L (ref 7–56)
URINE KETONE: NEGATIVE MMOL/L (ref 21–30)

## 2021-01-10 LAB — PROCALCITONIN: PROCALCITONIN: 0 ng/mL

## 2021-01-10 LAB — BNP (B-TYPE NATRIURETIC PEPTI): BNP: 115 pg/mL — ABNORMAL HIGH (ref 0–100)

## 2021-01-10 LAB — TROPONIN-I: TROPONIN I: 0 ng/mL (ref 0.0–0.05)

## 2021-01-10 LAB — LACTIC ACID (BG - RAPID LACTATE): LACTIC ACID(SYRINGE): 1.7 MMOL/L — ABNORMAL HIGH (ref 0.5–2.0)

## 2021-01-10 LAB — PROTIME INR (PT)
INR: 1 (ref 0.8–1.2)
PROTIME: 11 s (ref 9.5–14.2)

## 2021-01-10 LAB — URINALYSIS MICROSCOPIC REFLEX TO CULTURE

## 2021-01-10 LAB — CBC AND DIFF: WBC COUNT: 4.5 K/UL (ref 4.5–11.0)

## 2021-01-10 LAB — PTT (APTT): PTT: 28 s — ABNORMAL LOW (ref 24.0–36.5)

## 2021-01-10 LAB — PHOSPHORUS: PHOSPHORUS: 2 mg/dL (ref 2.0–4.5)

## 2021-01-10 LAB — COMPREHENSIVE METABOLIC PANEL: SODIUM: 139 MMOL/L — ABNORMAL LOW (ref 137–147)

## 2021-01-10 LAB — MAGNESIUM: MAGNESIUM: 1.9 mg/dL — ABNORMAL HIGH (ref 1.6–2.6)

## 2021-01-10 MED ORDER — REMDESIVIR 100MG IVPB (MB+)
100 mg | INTRAVENOUS | 0 refills | Status: DC
Start: 2021-01-10 — End: 2021-01-10

## 2021-01-10 MED ORDER — IPRATROPIUM-ALBUTEROL 20-100 MCG/ACTUATION IN MIST
2 | RESPIRATORY_TRACT | 0 refills | Status: DC | PRN
Start: 2021-01-10 — End: 2021-01-10

## 2021-01-10 MED ORDER — DOXYCYCLINE HYCLATE 100 MG PO TAB
100 mg | Freq: Two times a day (BID) | 0 refills | Status: AC
Start: 2021-01-10 — End: ?
  Administered 2021-01-11 – 2021-01-12 (×2): 100 mg

## 2021-01-10 MED ORDER — DOXYCYCLINE 100ML IVPB
100 mg | Freq: Two times a day (BID) | INTRAVENOUS | 0 refills | Status: AC
Start: 2021-01-10 — End: ?
  Administered 2021-01-11 (×2): 100 mg via INTRAVENOUS

## 2021-01-10 MED ORDER — PROPOFOL 10 MG/ML IV EMUL
5-70 ug/kg/min | INTRAVENOUS | 0 refills | Status: AC
Start: 2021-01-10 — End: ?

## 2021-01-10 MED ORDER — NALOXONE 0.4 MG/ML IJ SOLN
.08 mg | INTRAVENOUS | 0 refills | Status: AC | PRN
Start: 2021-01-10 — End: ?

## 2021-01-10 MED ORDER — FAMOTIDINE 40 MG/5 ML (8 MG/ML) PO SUSP
20 mg | Freq: Two times a day (BID) | NASOGASTRIC | 0 refills | Status: AC
Start: 2021-01-10 — End: ?
  Administered 2021-01-11 – 2021-01-13 (×5): 20 mg via NASOGASTRIC

## 2021-01-10 MED ORDER — DEXAMETHASONE(#) 4 MG/ML PO SUSP
5 mg | Freq: Every day | ORAL | 0 refills | Status: DC
Start: 2021-01-10 — End: 2021-01-10

## 2021-01-10 MED ORDER — PROPOFOL 10 MG/ML IV EMUL
5-70 ug/kg/min | INTRAVENOUS | 0 refills | Status: DC
Start: 2021-01-10 — End: 2021-01-10

## 2021-01-10 MED ORDER — IMS MIXTURE TEMPLATE
5 mg | Freq: Every day | ORAL | 0 refills | Status: DC
Start: 2021-01-10 — End: 2021-01-10

## 2021-01-10 MED ORDER — HEPARIN, PORCINE (PF) 5,000 UNIT/0.5 ML IJ SYRG
5000 [IU] | SUBCUTANEOUS | 0 refills | Status: AC
Start: 2021-01-10 — End: ?
  Administered 2021-01-10 – 2021-01-11 (×3): 5000 [IU] via SUBCUTANEOUS

## 2021-01-10 MED ORDER — PERFLUTREN LIPID MICROSPHERES 1.1 MG/ML IV SUSP
1-10 mL | Freq: Once | INTRAVENOUS | 0 refills | Status: AC | PRN
Start: 2021-01-10 — End: ?

## 2021-01-10 MED ORDER — IPRATROPIUM-ALBUTEROL 0.5 MG-3 MG(2.5 MG BASE)/3 ML IN NEBU
3 mL | Freq: Four times a day (QID) | RESPIRATORY_TRACT | 0 refills | Status: AC
Start: 2021-01-10 — End: ?
  Administered 2021-01-10 – 2021-01-14 (×11): 3 mL via RESPIRATORY_TRACT

## 2021-01-10 MED ORDER — BUPROPION HCL 75 MG PO TAB
75 mg | Freq: Two times a day (BID) | 0 refills | Status: AC
Start: 2021-01-10 — End: ?
  Administered 2021-01-11 – 2021-01-13 (×4): 75 mg

## 2021-01-10 MED ORDER — BARICITINIB 2 MG PO TAB
4 mg | Freq: Every day | NASOGASTRIC | 0 refills | Status: AC
Start: 2021-01-10 — End: ?
  Administered 2021-01-11 – 2021-01-12 (×2): 4 mg via NASOGASTRIC

## 2021-01-10 MED ORDER — DEXAMETHASONE SODIUM PHOSPHATE 10 MG/ML IJ SOLN
6 mg | Freq: Every day | INTRAVENOUS | 0 refills | Status: AC
Start: 2021-01-10 — End: ?
  Administered 2021-01-10 – 2021-01-14 (×5): 6 mg via INTRAVENOUS

## 2021-01-10 MED ORDER — IPRATROPIUM-ALBUTEROL 0.5 MG-3 MG(2.5 MG BASE)/3 ML IN NEBU
3 mL | RESPIRATORY_TRACT | 0 refills | Status: DC
Start: 2021-01-10 — End: 2021-01-10

## 2021-01-10 MED ORDER — ATORVASTATIN 40 MG PO TAB
40 mg | Freq: Every day | GASTROSTOMY | 0 refills | Status: AC
Start: 2021-01-10 — End: ?
  Administered 2021-01-11 – 2021-01-12 (×2): 40 mg via GASTROSTOMY

## 2021-01-10 MED ORDER — EZETIMIBE 10 MG PO TAB
10 mg | Freq: Every day | NASOGASTRIC | 0 refills | Status: AC
Start: 2021-01-10 — End: ?
  Administered 2021-01-11 – 2021-01-12 (×2): 10 mg via NASOGASTRIC

## 2021-01-10 MED ORDER — BISACODYL 10 MG RE SUPP
10 mg | Freq: Every day | RECTAL | 0 refills | Status: AC | PRN
Start: 2021-01-10 — End: ?

## 2021-01-10 MED ORDER — DEXAMETHASONE 6 MG PO TAB
6 mg | Freq: Every day | ORAL | 0 refills | Status: DC
Start: 2021-01-10 — End: 2021-01-10

## 2021-01-10 MED ORDER — REMDESIVIR 100MG IVPB (MB+)
100 mg | INTRAVENOUS | 0 refills | Status: AC
Start: 2021-01-10 — End: ?
  Administered 2021-01-11 – 2021-01-14 (×8): 100 mg via INTRAVENOUS

## 2021-01-10 MED ORDER — BUPROPION XL 150 MG PO TB24
150 mg | Freq: Every day | ORAL | 0 refills | Status: DC
Start: 2021-01-10 — End: 2021-01-10

## 2021-01-10 MED ORDER — FENTANYL DRIP IN NS 1000MCG/100ML
10-70 ug/h | INTRAVENOUS | 0 refills | Status: AC
Start: 2021-01-10 — End: ?

## 2021-01-10 MED ORDER — POLYETHYLENE GLYCOL 3350 17 GRAM PO PWPK
1 | Freq: Every day | ORAL | 0 refills | Status: AC
Start: 2021-01-10 — End: ?
  Administered 2021-01-11 – 2021-01-12 (×2): 17 g via ORAL

## 2021-01-10 NOTE — H&P (View-Only)
Trauma/Critical Care    Admission History and Physical Assessment    Name:  Joseph Dougherty                                                     MRN:  [id number]   Admission Date:  01/10/2021                     Assessment/Plan:    Principal Problem:    Acute on chronic respiratory failure with hypoxemia [facility])  Active Problems:    CAD (coronary artery disease)    COPD (chronic obstructive pulmonary disease) (HCC)    Allergy to radiographic contrast media    History of tobacco abuse    Hypercholesterolemia    Ischemic cardiomyopathy    Hx of CABG    COVID-19    Acute on chronic respiratory failure with hypoxia and hypercapnia (HCC)    Joseph Dougherty is a 65 y.o. male with PMH COPD on 3L O2, HLD, and ischemic cardiomyopathy s/p CABG who was transferred for acute on chronic respiratory failure and COVID infection.    Neurology:   Pain/Sedation:  > Propofol gtt  > Fentanyl gtt  > q4h neuro checks    Respiratory:  #Acute on chronic hypoxic and hypercarbic respiratory failure  #COPD  #COPD exacerbation, suspected  - On 3L O2 PTA  - PFTs (2015): Spirometry demonstrates a very severe obstructive defect with a severe reduction in vital capacity.  - GOLD D, grade 4 airflow obstruction  - On Spiriva, Symbicort, Duonebs, and Ventolin PTA  - ABG before intubation: 7.29/61/313/31  - ABG on admission: 7.31/58/92/25  Plan:  > Duonebs q4h  > Steroids as below  > Doxycycline x7 days  > Daily SBTs     #Hx of tobacco use  - 20.5 pack year hx  - Quit in 2013    Cardiology:  #HTN  #HLD  #CAD  #Ischemic cardiomyopathy  - On ASA, atorvastatin 40, Coreg 6.25, zetia, lasix 40, and Imdur 30 PTA  - BNP 115  Plan:  > Echo pending  > PTA antihypertensives held as BP marginal  > PTA atorvastatin and Zetia continued    GI:  > Famotidine 20 mg BID  > Miralax    Renal:  No acute concerns    ID:  #COVID-19  - Symptoms started ~12/4  - Tested positive 12/5  - Started on zosyn, linezolid, remdesivir, and decadron at OSH  - LA: 1.7  - CXR at OSH with no consolidation  Plan:  > Repeat CXR  > Zosyn and linezolid held  > Course of doxycycline for COPD exacerbation as above  > Complete course of remdesivir as patient was started at OSH  > Start baricitinib as patient is intubated  > Continue decadron 6mg  dailiy  > Blood cultures pending  > Sputum cultures pending  > Procal pending  > RVP pending  > UA pending    Endocrine:  No acute concerns   - Last A1c (11/24/20): 5.9    Prophylaxis Review:  Lines: arterial line, central line, PIV x2  Urinary Catheter:  Foley  Antibiotic Usage:  No  VTE:  Lovenox, SCDs    Disposition/Family:   Admit to MICU  Code Status:  Full Code     Patient seen and discussed with Dr. Cena Benton.  Alda Berthold, MD  PGY-1 Anesthesiology  Available on Voalte    __________________________________________________________________________________  Chief Complaint:  Shortness of breath  History of Present Illness:  Joseph Dougherty is a 65 y.o. male with PMH COPD, chronic hypoxic respiratory failure, HTN, HLD, and ischemic cardiomyopathy s/p CABG who presented to OSH for increasing shortness of breath. Symptoms started 12/4 with worsening shortness of breath not improved with breathing treatments. On 3L O2 at home. EMS was called after he started only being able to speak 1-2 word sentences. He was placed on oxygen and breathing treatments given. On arrival to OSH ER, he had significant respiratory distress with agonal gasping respirations. He was subsequently intubated and given a dose of Zosyn and linezolid before being transferred to [facility] for further management.     On arrival, patient is intubated and sedated. He is able to follow commands and is in no acute distress.     Medical History:   Diagnosis Date   ? Allergy to radiographic contrast media 01/15/2007   ? Asthma    ? cad    ? CAD (coronary artery disease)    ? Cardiomyopathy (HCC)    ? Chronic systolic heart failure (HCC) 05/31/2012   ? COPD (chronic obstructive pulmonary disease) (HCC) 03/30/2006   ? Dependence on supplemental oxygen 05/31/2012   ? Dyspnea on exertion 05/31/2012   ? History of tobacco abuse 08/25/2008   ? 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 Hypercholesterolemia 08/25/2008   ? Hyperlipemia    ? Hypertension    ? Hypertensive cardiovascular disease    ? Ischemic cardiomyopathy 05/31/2012   ? On supplemental oxygen therapy    ? Restless legs syndrome 01/17/2011   ? Sinus tachycardia 08/25/2008     Surgical History:   Procedure Laterality Date   ? HX CORONARY ARTERY BYPASS GRAFT     ? HX HEART CATHETERIZATION       Family history reviewed; non-contributory  Social History     Tobacco Use   ? Smoking status: Former     Packs/day: 0.50     Years: 41.00     Pack years: 20.50     Types: Cigarettes     Quit date: 02/22/2011     Years since quitting: 9.8   ? Smokeless tobacco: Never   ? Tobacco comments:     PREVIOUSLY 3PPD; AFTER MI, 1/2 PPD   Substance and Sexual Activity   ? Alcohol use: Yes     Comment: OCCASIONALLY   ? Drug use: No   ? Sexual activity: Never           Immunizations (includes history and patient reported):   Immunization History   Administered Date(s) Administered   ? COVID-19 (MODERNA), mRNA vacc, 100 mcg/0.5 mL (PF) 05/24/2019, 06/21/2019   ? Flu Vaccine Trivalent =>3 Yo (Preservative Free) 10/28/2013      Allergies:  Effexor [venlafaxine]; Penicillin g; Contrast dye iv, iodine containing [iodinated contrast media]; and Shellfish containing products    Medications:    Current Facility-Administered Medications   Medication   ? albuterol-ipratropium (DUONEB) nebulizer solution 3 mL   ? [START ON 01/11/2021] atorvastatin (LIPITOR) tablet 40 mg   ? baricitinib (OLUMIANT) tablet 4 mg   ? bisacodyL (DULCOLAX) rectal suppository 10 mg   ? [START ON 01/11/2021] buPROPion HCL (WELLBUTRIN) tablet 75 mg   ? dexAMETHasone sodium phosphate (DECADRON) injection 6 mg   ? doxycycline (VIBRAMYCIN) 100 mg in dextrose 5% (D5W) 100 mL IVPB  Or   ? doxycycline hyclate (VIBRACIN) tablet 100 mg   ? [START ON 01/11/2021] ezetimibe (ZETIA) tablet 10 mg   ? famotidine (PEPCID) oral suspension 20 mg   ? fentaNYL (SUBLIMAZE)  1000 mcg/100 mL NS IV drip (std conc)(premade)   ? FENTANYL DRIP IN NS 1000MCG/100ML [employer])   ? heparin (porcine) PF syringe 5,000 Units   ? nalOXone (NARCAN) injection 0.08 mg   ? perflutren lipid microspheres (DEFINITY) injection 1-10 Diluted mL   ? polyethylene glycol 3350 (MIRALAX) packet 17 g   ? propofoL (DIPRIVAN) 10 mg/mL IV drip   ? [START ON 01/11/2021] remdesivir 100 mg in sodium chloride 0.9% (NS) 100 mL IVPB (MB+)   ? SODIUM CHLORIDE 0.9 % IV SOLP [employer])     Review of Systems:  Review of systems not obtained from patient due to patient factors.      Vital Signs: Last Filed In 24 Hours                Vital Signs: 24 Hour Range   BP: 99/70 (12/05 1440)  Temp: 36.4 ?C (97.6 ?F) (12/05 1440)  Pulse: 98 (12/05 1511)  Respirations: 16 PER MINUTE (12/05 1511)  SpO2: 100 % (12/05 1511)  BP: (99)/(70)   Temp:  [36.4 ?C (97.6 ?F)]   Pulse:  [94-98]   Respirations:  [16 PER MINUTE-18 PER MINUTE]   SpO2:  [100 %]    CPOT Score Total: 0      Physical Exam:   General appearance: sedated and intubated  Head: Normocephalic, without obvious abnormality, atraumatic  Eyes: negative findings: conjunctivae and sclerae normal and pupils equal, round, reactive to light and accomodation  Lungs: diminished breath sounds bilaterally  Heart: regular rate and rhythm, S1, S2 normal, no murmur, click, rub or gallop  Abdomen: distended, soft  Neurologic: sedated, follows in all four extremities  Skin: Skin color, texture, turgor normal. No rashes or lesions     Ventilator/ Respiratory Support:  Yes: Mode: V/AC+  Set Vt (ml):  [500 milliliters]   Expired Tidal Volume Spont (mL):  [498 milliliters-604 milliliters]   Set RR:  [14 breaths/minutes]   Total Respiratory Rate (Breaths/Min):  [14 breaths/minutes]   Minute Volume (L/min):  [7.01 liters/minutes-7.07 liters/minutes]   %MVspon:  [0 %]   O2%: [40 %-50 %]   PIP Actual:  [21 cm H20-22 cm H20]   PEEP/CPAP:  [5 cm H2O]   Mean Airway Pressure:  [9 cm H2O]      Lab:  Pertinent labs reviewed       Radiology and Other Diagnostic Procedures Review:    Pertinent radiology reviewed.     Nutrition: Dietitian Documentation                                              Wound: Wound Documentation  Alda Berthold, MD  Pager

## 2021-01-11 ENCOUNTER — Inpatient Hospital Stay: Admit: 2021-01-11 | Discharge: 2021-01-11 | Payer: MEDICARE

## 2021-01-11 ENCOUNTER — Encounter: Admit: 2021-01-11 | Discharge: 2021-01-11 | Payer: MEDICARE

## 2021-01-11 MED ADMIN — INSULIN ASPART 100 UNIT/ML SC FLEXPEN [87504]: 1 [IU] | SUBCUTANEOUS | @ 13:00:00 | NDC 00169633910

## 2021-01-11 MED ADMIN — PERFLUTREN LIPID MICROSPHERES 1.1 MG/ML IV SUSP [79178]: 2 mL | INTRAVENOUS | @ 17:00:00 | Stop: 2021-01-11 | NDC 11994001116

## 2021-01-11 MED ADMIN — CARVEDILOL 6.25 MG PO TAB [77309]: 6.25 mg | ORAL | NDC 00904630161

## 2021-01-11 MED ADMIN — HYDROXYZINE HCL 25 MG PO TAB [3774]: 25 mg | ORAL | NDC 63739048610

## 2021-01-11 NOTE — Progress Notes
I have reviewed the notes, assessments, and/or procedures performed by Pennelope Bracken, RN, and concur with his documentation unless otherwise noted.

## 2021-01-12 ENCOUNTER — Encounter: Admit: 2021-01-12 | Discharge: 2021-01-12 | Payer: MEDICARE

## 2021-01-12 DIAGNOSIS — Z9981 Dependence on supplemental oxygen: Secondary | ICD-10-CM

## 2021-01-12 DIAGNOSIS — J45909 Unspecified asthma, uncomplicated: Secondary | ICD-10-CM

## 2021-01-12 DIAGNOSIS — E7800 Pure hypercholesterolemia, unspecified: Secondary | ICD-10-CM

## 2021-01-12 DIAGNOSIS — G2581 Restless legs syndrome: Secondary | ICD-10-CM

## 2021-01-12 DIAGNOSIS — Z951 Presence of aortocoronary bypass graft: Secondary | ICD-10-CM

## 2021-01-12 DIAGNOSIS — R0609 Other forms of dyspnea: Secondary | ICD-10-CM

## 2021-01-12 DIAGNOSIS — I5022 Chronic systolic (congestive) heart failure: Secondary | ICD-10-CM

## 2021-01-12 DIAGNOSIS — R000 Tachycardia, unspecified: Secondary | ICD-10-CM

## 2021-01-12 DIAGNOSIS — I10 Essential (primary) hypertension: Secondary | ICD-10-CM

## 2021-01-12 DIAGNOSIS — J449 Chronic obstructive pulmonary disease, unspecified: Secondary | ICD-10-CM

## 2021-01-12 DIAGNOSIS — I2510 Atherosclerotic heart disease of native coronary artery without angina pectoris: Secondary | ICD-10-CM

## 2021-01-12 DIAGNOSIS — I429 Cardiomyopathy, unspecified: Secondary | ICD-10-CM

## 2021-01-12 DIAGNOSIS — I119 Hypertensive heart disease without heart failure: Secondary | ICD-10-CM

## 2021-01-12 DIAGNOSIS — I255 Ischemic cardiomyopathy: Secondary | ICD-10-CM

## 2021-01-12 DIAGNOSIS — Z91041 Radiographic dye allergy status: Secondary | ICD-10-CM

## 2021-01-12 DIAGNOSIS — E785 Hyperlipidemia, unspecified: Secondary | ICD-10-CM

## 2021-01-12 DIAGNOSIS — Z87891 Personal history of nicotine dependence: Secondary | ICD-10-CM

## 2021-01-12 MED ADMIN — ISOSORBIDE MONONITRATE 30 MG PO TB24 [24521]: 30 mg | ORAL | @ 17:00:00 | NDC 00904644961

## 2021-01-12 MED ADMIN — CARVEDILOL 6.25 MG PO TAB [77309]: 6.25 mg | ORAL | @ 14:00:00 | NDC 00904630161

## 2021-01-12 MED ADMIN — MULTIVIT-IRON-FA-CALCIUM-MINS 9 MG IRON-400 MCG PO TAB [172795]: 1 | ORAL | @ 14:00:00 | NDC 00904549261

## 2021-01-12 MED ADMIN — ENOXAPARIN 40 MG/0.4 ML SC SYRG [85052]: 40 mg | SUBCUTANEOUS | @ 03:00:00 | NDC 00781324602

## 2021-01-13 ENCOUNTER — Encounter: Admit: 2021-01-13 | Discharge: 2021-01-13 | Payer: MEDICARE

## 2021-01-13 MED ADMIN — EZETIMIBE 10 MG PO TAB [86887]: 10 mg | ORAL | @ 15:00:00 | NDC 67877049030

## 2021-01-13 MED ADMIN — BARICITINIB 2 MG PO TAB [336717]: 4 mg | ORAL | @ 15:00:00 | Stop: 2021-01-20 | NDC 00002418230

## 2021-01-13 MED ADMIN — ATORVASTATIN 40 MG PO TAB [77113]: 40 mg | ORAL | @ 15:00:00 | NDC 00904629261

## 2021-01-13 MED ADMIN — CARVEDILOL 6.25 MG PO TAB [77309]: 6.25 mg | ORAL | @ 15:00:00 | NDC 00904630161

## 2021-01-13 MED ADMIN — FAMOTIDINE 20 MG PO TAB [10011]: 20 mg | ORAL | @ 15:00:00 | NDC 63739064510

## 2021-01-13 MED ADMIN — ISOSORBIDE MONONITRATE 30 MG PO TB24 [24521]: 30 mg | ORAL | @ 15:00:00 | NDC 00904644961

## 2021-01-13 MED ADMIN — MULTIVIT-IRON-FA-CALCIUM-MINS 9 MG IRON-400 MCG PO TAB [172795]: 1 | ORAL | @ 15:00:00 | NDC 00904549261

## 2021-01-13 MED ADMIN — ENOXAPARIN 40 MG/0.4 ML SC SYRG [85052]: 40 mg | SUBCUTANEOUS | @ 04:00:00 | NDC 00781324602

## 2021-01-13 MED ADMIN — GUAIFENESIN 100 MG/5 ML PO LIQD [79220]: 100 mg | ORAL | @ 17:00:00 | NDC 00121174405

## 2021-01-13 MED ADMIN — DOXYCYCLINE HYCLATE 100 MG PO TAB [2625]: 100 mg | ORAL | @ 04:00:00 | Stop: 2021-01-16 | NDC 00904043004

## 2021-01-13 MED ADMIN — CARVEDILOL 6.25 MG PO TAB [77309]: 6.25 mg | ORAL | @ 04:00:00 | NDC 00904630161

## 2021-01-13 MED ADMIN — DICLOFENAC SODIUM 1 % TP GEL [168032]: 2 g | TOPICAL | @ 21:00:00 | NDC 69097052444

## 2021-01-13 MED ADMIN — DOXYCYCLINE HYCLATE 100 MG PO TAB [2625]: 100 mg | ORAL | @ 15:00:00 | Stop: 2021-01-20 | NDC 00904043004

## 2021-01-13 MED ADMIN — ACETAMINOPHEN 325 MG PO TAB [101]: 650 mg | ORAL | @ 20:00:00 | NDC 00904677361

## 2021-01-13 MED ADMIN — BUDESONIDE-FORMOTEROL 160-4.5 MCG/ACTUATION IN HFAA [163006]: 2 | RESPIRATORY_TRACT | @ 03:00:00 | NDC 00186037028

## 2021-01-13 MED ADMIN — BUPROPION XL 150 MG PO TB24 [88619]: 150 mg | ORAL | @ 15:00:00 | NDC 50268014011

## 2021-01-13 MED FILL — PREDNISONE 10 MG PO TAB: 10 mg | ORAL | 9 days supply | Qty: 21 | Fill #1 | Status: AC

## 2021-01-13 MED FILL — DOXYCYCLINE HYCLATE 100 MG PO TAB: 100 mg | ORAL | 3 days supply | Qty: 5 | Fill #1 | Status: AC

## 2021-01-14 ENCOUNTER — Encounter: Admit: 2021-01-14 | Discharge: 2021-01-14 | Payer: MEDICARE

## 2021-01-14 MED ADMIN — CARVEDILOL 6.25 MG PO TAB [77309]: 6.25 mg | ORAL | @ 03:00:00 | NDC 00904630161

## 2021-01-14 MED ADMIN — HYDROXYZINE HCL 25 MG PO TAB [3774]: 25 mg | ORAL | @ 03:00:00 | NDC 63739048610

## 2021-01-14 MED ADMIN — CARVEDILOL 6.25 MG PO TAB [77309]: 6.25 mg | ORAL | @ 15:00:00 | Stop: 2021-01-14 | NDC 00904630161

## 2021-01-14 MED ADMIN — DOXYCYCLINE HYCLATE 100 MG PO TAB [2625]: 100 mg | ORAL | @ 15:00:00 | Stop: 2021-01-14 | NDC 00904043004

## 2021-01-14 MED ADMIN — BUPROPION XL 150 MG PO TB24 [88619]: 150 mg | ORAL | @ 15:00:00 | Stop: 2021-01-14 | NDC 50268014011

## 2021-01-14 MED ADMIN — FAMOTIDINE 20 MG PO TAB [10011]: 20 mg | ORAL | @ 15:00:00 | Stop: 2021-01-14 | NDC 63739064510

## 2021-01-14 MED ADMIN — ATORVASTATIN 40 MG PO TAB [77113]: 40 mg | ORAL | @ 15:00:00 | Stop: 2021-01-14 | NDC 00904629261

## 2021-01-14 MED ADMIN — ISOSORBIDE MONONITRATE 30 MG PO TB24 [24521]: 30 mg | ORAL | @ 15:00:00 | Stop: 2021-01-14 | NDC 00904644961

## 2021-01-14 MED ADMIN — MULTIVIT-IRON-FA-CALCIUM-MINS 9 MG IRON-400 MCG PO TAB [172795]: 1 | ORAL | @ 15:00:00 | Stop: 2021-01-14 | NDC 00904549261

## 2021-01-14 MED ADMIN — ENOXAPARIN 40 MG/0.4 ML SC SYRG [85052]: 40 mg | SUBCUTANEOUS | @ 03:00:00 | NDC 00781324602

## 2021-01-14 MED ADMIN — DOXYCYCLINE HYCLATE 100 MG PO TAB [2625]: 100 mg | ORAL | @ 03:00:00 | Stop: 2021-01-20 | NDC 00904043004

## 2021-01-14 MED ADMIN — PNEUMOC 20-VAL CONJ-DIP CR(PF) 0.5 ML IM SYRG [457021]: 0.5 mL | INTRAMUSCULAR | @ 17:00:00 | Stop: 2021-01-14 | NDC 00005200001

## 2021-01-14 MED ADMIN — BARICITINIB 2 MG PO TAB [336717]: 4 mg | ORAL | @ 15:00:00 | Stop: 2021-01-14 | NDC 00002418230

## 2021-01-14 MED ADMIN — FAMOTIDINE 20 MG PO TAB [10011]: 20 mg | ORAL | @ 03:00:00 | NDC 63739064510

## 2021-01-14 MED ADMIN — EZETIMIBE 10 MG PO TAB [86887]: 10 mg | ORAL | @ 15:00:00 | Stop: 2021-01-14 | NDC 67877049030

## 2021-01-14 MED FILL — DOXYCYCLINE HYCLATE 100 MG PO TAB: 100 mg | ORAL | 1 days supply | Qty: 2 | Fill #1 | Status: CP

## 2021-01-14 MED FILL — BUDESONIDE-FORMOTEROL 160-4.5 MCG/ACTUATION IN HFAA: RESPIRATORY_TRACT | 30 days supply | Qty: 10.2 | Fill #1 | Status: CP

## 2021-01-14 MED FILL — DICLOFENAC SODIUM 1 % TP GEL: 1 % | TOPICAL | 38 days supply | Qty: 3 | Fill #1 | Status: CP

## 2021-06-01 ENCOUNTER — Encounter: Admit: 2021-06-01 | Discharge: 2021-06-01 | Payer: MEDICARE

## 2021-06-01 MED ORDER — CARVEDILOL 6.25 MG PO TAB
ORAL_TABLET | ORAL | 3 refills | 90.00000 days | Status: AC
Start: 2021-06-01 — End: ?

## 2021-06-01 MED ORDER — FUROSEMIDE 40 MG PO TAB
ORAL_TABLET | ORAL | 3 refills | 90.00000 days | Status: AC
Start: 2021-06-01 — End: ?

## 2021-06-07 ENCOUNTER — Encounter: Admit: 2021-06-07 | Discharge: 2021-06-07 | Payer: MEDICARE

## 2021-06-07 MED ORDER — ISOSORBIDE MONONITRATE 30 MG PO TB24
30 mg | ORAL_TABLET | Freq: Every morning | ORAL | 3 refills | 90.00000 days | Status: AC
Start: 2021-06-07 — End: ?
  Filled 2021-06-07: qty 90, 90d supply, fill #1

## 2021-06-09 ENCOUNTER — Encounter: Admit: 2021-06-09 | Discharge: 2021-06-09 | Payer: MEDICARE

## 2021-06-10 ENCOUNTER — Encounter: Admit: 2021-06-10 | Discharge: 2021-06-10 | Payer: MEDICARE

## 2021-06-21 ENCOUNTER — Encounter: Admit: 2021-06-21 | Discharge: 2021-06-21 | Payer: MEDICARE

## 2021-06-21 MED ORDER — ATORVASTATIN 40 MG PO TAB
40 mg | ORAL_TABLET | Freq: Every day | ORAL | 0 refills | Status: AC
Start: 2021-06-21 — End: ?

## 2021-06-21 MED ORDER — POTASSIUM CHLORIDE 10 MEQ PO TBER
20 meq | ORAL_TABLET | Freq: Every day | ORAL | 0 refills | 30.00000 days | Status: AC
Start: 2021-06-21 — End: ?

## 2021-06-30 ENCOUNTER — Encounter: Admit: 2021-06-30 | Discharge: 2021-06-30 | Payer: MEDICARE

## 2021-06-30 MED ORDER — EZETIMIBE 10 MG PO TAB
10 mg | ORAL_TABLET | Freq: Every day | ORAL | 3 refills | Status: AC
Start: 2021-06-30 — End: ?

## 2021-07-21 ENCOUNTER — Encounter: Admit: 2021-07-21 | Discharge: 2021-07-21 | Payer: MEDICARE

## 2021-07-21 ENCOUNTER — Ambulatory Visit: Admit: 2021-07-21 | Discharge: 2021-07-22 | Payer: MEDICARE

## 2021-07-21 DIAGNOSIS — I2510 Atherosclerotic heart disease of native coronary artery without angina pectoris: Secondary | ICD-10-CM

## 2021-07-21 DIAGNOSIS — R000 Tachycardia, unspecified: Secondary | ICD-10-CM

## 2021-07-21 DIAGNOSIS — J45909 Unspecified asthma, uncomplicated: Secondary | ICD-10-CM

## 2021-07-21 DIAGNOSIS — I5022 Chronic systolic (congestive) heart failure: Secondary | ICD-10-CM

## 2021-07-21 DIAGNOSIS — R0609 Other forms of dyspnea: Secondary | ICD-10-CM

## 2021-07-21 DIAGNOSIS — Z951 Presence of aortocoronary bypass graft: Secondary | ICD-10-CM

## 2021-07-21 DIAGNOSIS — E7800 Pure hypercholesterolemia, unspecified: Secondary | ICD-10-CM

## 2021-07-21 DIAGNOSIS — I10 Essential (primary) hypertension: Secondary | ICD-10-CM

## 2021-07-21 DIAGNOSIS — J449 Chronic obstructive pulmonary disease, unspecified: Secondary | ICD-10-CM

## 2021-07-21 DIAGNOSIS — Z87891 Personal history of nicotine dependence: Secondary | ICD-10-CM

## 2021-07-21 DIAGNOSIS — Z91041 Radiographic dye allergy status: Secondary | ICD-10-CM

## 2021-07-21 DIAGNOSIS — J432 Centrilobular emphysema: Secondary | ICD-10-CM

## 2021-07-21 DIAGNOSIS — I429 Cardiomyopathy, unspecified: Secondary | ICD-10-CM

## 2021-07-21 DIAGNOSIS — G2581 Restless legs syndrome: Secondary | ICD-10-CM

## 2021-07-21 DIAGNOSIS — E785 Hyperlipidemia, unspecified: Secondary | ICD-10-CM

## 2021-07-21 DIAGNOSIS — I255 Ischemic cardiomyopathy: Secondary | ICD-10-CM

## 2021-07-21 DIAGNOSIS — I119 Hypertensive heart disease without heart failure: Secondary | ICD-10-CM

## 2021-07-21 DIAGNOSIS — Z9981 Dependence on supplemental oxygen: Secondary | ICD-10-CM

## 2021-07-21 NOTE — Assessment & Plan Note
He has not had any symptoms that would suggest progression of coronary disease.  I am not planning to do surveillance stress testing.

## 2021-07-21 NOTE — Progress Notes
Date of Service: 07/21/2021    Joseph Dougherty is a 66 y.o. male.       HPI     Joseph Dougherty and Joseph Dougherty were in the [facility] clinic today for follow-up regarding coronary disease.  They are planning to drive up to [place] for a couple of weeks later in the month.    Joseph Dougherty has done fairly well over the last 6 months.  His COPD is obviously quite severe and Pam says that she thinks his breathing has been a little bit worse lately.  He is not having any palpitations or syncope nor is he having any chest discomfort.  He denies any problems with peripheral edema.         Vitals:    07/21/21 1337   BP: 104/66   BP Source: Arm, Left Upper   Pulse: 102   SpO2: 92%   O2 Percent: 92 %   O2 Device: Nasal cannula   O2 Liter Flow: 3 Lpm   PainSc: Zero   Weight: 74.7 kg (164 lb 9.6 oz)   Height: 162.6 cm (5' 4)     Body mass index is 28.25 kg/m?Marland Kitchen     Past Medical History  Patient Active Problem List    Diagnosis Date Noted   ? Acute on chronic respiratory failure with hypoxemia (HCC) 01/10/2021   ? COVID-19 01/10/2021   ? Acute on chronic respiratory failure with hypoxia and hypercapnia (HCC) 01/10/2021   ? Ischemic cardiomyopathy 05/31/2012   ? Dependence on supplemental oxygen 05/31/2012   ? 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 Restless legs syndrome 01/17/2011   ? History of tobacco abuse 08/25/2008   ? Hypercholesterolemia 08/25/2008   ? Allergy to radiographic contrast media 01/15/2007   ? CAD (coronary artery disease) 03/30/2006      9/05 - Inferior MI with thrombolytic, anatomy not suitable for PCI   9/05 - Emergent CABG x 2: SVG-right posterolateral and SVG-ramus.   11/05 - ECOD: LVEF 50%, mild posterior hypokinesis.  (EF 59% per LVgram).                 Status post cardiac rehab participation.   5/06 - Persantine Myoview stress test ([place], [place]):  EF 60%, no ischemia   12/08 - Cardiac cath: mild LV dysfunction with previous inferior wall infarction.                Two vessel CAD with patent vein graft to intermediate vessel and patent                vein graft to RPLV branch. No significant LAD or Cx disease. Allergic                 reaction to contrast dye with manifestation of hives treated with Benadryl                and Solu-Medrol.  05/2012 - Cath @ .  No significant progression of CAD.  Grafts patent.  EF 45-50%.     ? COPD (chronic obstructive pulmonary disease) (HCC) 03/30/2006     He has a long smoking history and was first told he had COPD in 2008 and initially saw Dr. Trixie Dredge. Earlene Plater in the fellows clinic.  He has required hospitalization nearly yearly the last 3-4 years.  He thinks he may have had a pneumothorax in the past also.  He was started on nocturnal oxygen late 2008.   He has normal alpha one antitrypsin testing in 2008.  He quit smoking in 1/13.  He has been on Spiriva and Advair for several years but switched to Symbicort in 2014.  Azithromycin started 11/2013             Review of Systems   Constitutional: Positive for weight gain.   HENT: Positive for congestion, ear pain, nosebleeds and stridor.    Eyes: Negative.    Cardiovascular: Positive for claudication and dyspnea on exertion.   Respiratory: Positive for cough, shortness of breath and wheezing.    Endocrine: Positive for cold intolerance and heat intolerance.   Hematologic/Lymphatic: Bruises/bleeds easily.   Skin: Positive for dry skin.   Musculoskeletal: Positive for muscle cramps.   Gastrointestinal: Negative.    Genitourinary: Negative.    Neurological: Positive for excessive daytime sleepiness and weakness.   Psychiatric/Behavioral: Positive for depression. The patient has insomnia.    Allergic/Immunologic: Negative.        Physical Exam    Physical Exam   General Appearance: no distress   Skin: warm, no ulcers or xanthomas   Digits and Nails: no cyanosis or clubbing   Eyes: conjunctivae and lids normal, pupils are equal and round   Teeth/Gums/Palate: dentition unremarkable, no lesions   Lips & Oral Mucosa: no pallor or cyanosis   Neck Veins: normal JVP , neck veins are not distended   Thyroid: no nodules, masses, tenderness or enlargement   Chest Inspection: chest is normal in appearance   Respiratory Effort: breathing comfortably, no respiratory distress   Auscultation/Percussion: lungs clear to auscultation, no rales or rhonchi, no wheezing   PMI: PMI not enlarged or displaced   Cardiac Rhythm: regular rhythm and normal rate   Cardiac Auscultation: S1, S2 normal, no rub, no gallop   Murmurs: no murmur   Peripheral Circulation: normal peripheral circulation   Carotid Arteries: normal carotid upstroke bilaterally, no bruits   Radial Arteries: normal symmetric radial pulses   Abdominal Aorta: no abdominal aortic bruit   Pedal Pulses: normal symmetric pedal pulses   Lower Extremity Edema: no lower extremity edema   Abdominal Exam: soft, non-tender, no masses, bowel sounds normal   Liver & Spleen: no organomegaly   Gait & Station: walks without assistance   Muscle Strength: normal muscle tone   Orientation: oriented to time, place and person   Affect & Mood: appropriate and sustained affect   Language and Memory: patient responsive and seems to comprehend information   Neurologic Exam: neurological assessment grossly intact   Other: moves all extremities      Cardiovascular Health Factors  Vitals BP Readings from Last 3 Encounters:   07/21/21 104/66   01/14/21 (!) 148/98   12/16/20 124/72     Wt Readings from Last 3 Encounters:   07/21/21 74.7 kg (164 lb 9.6 oz)   01/12/21 75.1 kg (165 lb 9.1 oz)   12/16/20 74.2 kg (163 lb 9.6 oz)     BMI Readings from Last 3 Encounters:   07/21/21 28.25 kg/m?   01/12/21 25.17 kg/m?   12/16/20 28.08 kg/m?      Smoking Social History     Tobacco Use   Smoking Status Former   ? Packs/day: 0.50   ? Years: 41.00   ? Pack years: 20.50   ? Types: Cigarettes   ? Quit date: 02/22/2011   ? Years since quitting: 10.4   Smokeless Tobacco Never   Tobacco Comments    PREVIOUSLY 3PPD; AFTER MI, 1/2 PPD      Lipid Profile Cholesterol  Date Value Ref Range Status   11/24/2020 132  Final     HDL   Date Value Ref Range Status   11/24/2020 33 (L) >-40 Final     LDL   Date Value Ref Range Status   11/24/2020 62  Final     Triglycerides   Date Value Ref Range Status   11/24/2020 189 (H) <150 Final      Blood Sugar Hemoglobin A1C   Date Value Ref Range Status   11/24/2020 5.9  Final     Glucose   Date Value Ref Range Status   01/14/2021 120 (H) 70 - 100 MG/DL Final   16/11/9602 540 (H) 70 - 100 MG/DL Final   98/12/9145 829 (H) 70 - 100 MG/DL Final     Glucose, POC   Date Value Ref Range Status   01/14/2021 123 (H) 70 - 100 MG/DL Final   56/21/3086 578 (H) 70 - 100 MG/DL Final   46/96/2952 841 (H) 70 - 100 MG/DL Final          Problems Addressed Today  Encounter Diagnoses   Name Primary?   ? Centrilobular emphysema (HCC)    ? Coronary artery disease involving native coronary artery of native heart without angina pectoris    ? Hypercholesterolemia        Assessment and Plan       COPD (chronic obstructive pulmonary disease) (HCC)  His lung disease is by far his most limiting pathology.  Unfortunately I think he is having slow progression but continues to enjoy life and participate in some great family activities.    CAD (coronary artery disease)  He has not had any symptoms that would suggest progression of coronary disease.  I am not planning to do surveillance stress testing.    Hypercholesterolemia  Lab Results   Component Value Date    CHOL 132 11/24/2020    TRIG 189 (H) 11/24/2020    HDL 33 (L) 11/24/2020    LDL 62 11/24/2020    VLDL 38 11/24/2020    NONHDLCHOL 86 05/06/2012    CHOLHDLC 4 11/24/2020      His LDL is treated to goal.      Current Medications (including today's revisions)  ? albuterol (VENTOLIN HFA) 90 mcg/actuation inhaler Inhale two puffs by mouth into the lungs every 6 hours as needed for Wheezing or Shortness of Breath. Shake well before use.   ? albuterol-ipratropium (DUONEB) 0.5 mg-3 mg(2.5 mg base)/3 mL nebulizer solution Inhale 3 mL solution by nebulizer as directed as Needed.   ? aspirin 325 mg tablet Take one tablet by mouth daily.   ? atorvastatin (LIPITOR) 40 mg tablet Take one tablet by mouth daily.   ? azithromycin (ZITHROMAX) 250 mg tablet 1 Tab three times weekly. .   ? budesonide-formoterol HFA (SYMBICORT) 160-4.5 mcg/actuation aerosol inhaler Inhale two puffs by mouth into the lungs twice daily.   ? carvediloL (COREG) 6.25 mg tablet TAKE 1 TABLET BY MOUTH TWICE A  DAY WITH MEALS/FOOD   ? diclofenac sodium (VOLTAREN) 1 % topical gel Apply two g topically to affected area four times daily as needed (pain). Indications: Back pain   ? ezetimibe (ZETIA) 10 mg tablet Take one tablet by mouth daily.   ? FLUoxetine (PROZAC) 20 mg capsule Take one capsule by mouth daily.   ? furosemide (LASIX) 40 mg tablet TAKE 1 TABLET DAILY   ? isosorbide mononitrate ER (IMDUR) 30 mg tablet,extended release 24 hr Take one tablet by  mouth every morning.   ? nitroglycerin (NITROSTAT) 0.4 mg tablet Place one tablet under tongue every 5 minutes as needed for Chest Pain. FOR CHEST PAIN   ? potassium chloride (KLOR-CON 10) 10 mEq tablet Take two tablets by mouth daily. Take with a meal and a full glass of water.   ? temazepam (RESTORIL) 15 mg capsule Take 1 Cap by mouth at bedtime as needed. (Patient taking differently: Take one capsule by mouth at bedtime daily.)   ? tiotropium bromide (SPIRIVA) 18 mcg capsule for inhaler Place one capsule into inhaler and inhale into lungs as directed daily.     Total time spent on today's office visit was 30 minutes.  This includes face-to-face in person visit with patient as well as nonface-to-face time including review of the EMR, outside records, labs, radiologic studies, echocardiogram & other cardiovascular studies, formation of treatment plan, after visit summary, future disposition, and lastly on documentation.

## 2021-07-21 NOTE — Assessment & Plan Note
His lung disease is by far his most limiting pathology.  Unfortunately I think he is having slow progression but continues to enjoy life and participate in some great family activities.

## 2021-07-21 NOTE — Assessment & Plan Note
Lab Results   Component Value Date    CHOL 132 11/24/2020    TRIG 189 (H) 11/24/2020    HDL 33 (L) 11/24/2020    LDL 62 11/24/2020    VLDL 38 11/24/2020    NONHDLCHOL 86 05/06/2012    CHOLHDLC 4 11/24/2020      His LDL is treated to goal.

## 2021-09-03 ENCOUNTER — Encounter: Admit: 2021-09-03 | Discharge: 2021-09-03 | Payer: MEDICARE

## 2021-09-03 MED ORDER — POTASSIUM CHLORIDE 10 MEQ PO TBER
ORAL_TABLET | 3 refills
Start: 2021-09-03 — End: ?

## 2021-09-26 ENCOUNTER — Encounter: Admit: 2021-09-26 | Discharge: 2021-09-26 | Payer: MEDICARE

## 2021-09-26 MED ORDER — ATORVASTATIN 40 MG PO TAB
40 mg | ORAL_TABLET | Freq: Every day | ORAL | 3 refills | Status: AC
Start: 2021-09-26 — End: ?

## 2022-02-01 ENCOUNTER — Encounter: Admit: 2022-02-01 | Discharge: 2022-02-01 | Payer: MEDICARE

## 2022-02-07 ENCOUNTER — Encounter: Admit: 2022-02-07 | Discharge: 2022-02-07 | Payer: MEDICARE

## 2022-02-07 DIAGNOSIS — J45909 Unspecified asthma, uncomplicated: Secondary | ICD-10-CM

## 2022-02-07 DIAGNOSIS — I255 Ischemic cardiomyopathy: Secondary | ICD-10-CM

## 2022-02-07 DIAGNOSIS — J449 Chronic obstructive pulmonary disease, unspecified: Secondary | ICD-10-CM

## 2022-02-07 DIAGNOSIS — Z951 Presence of aortocoronary bypass graft: Secondary | ICD-10-CM

## 2022-02-07 DIAGNOSIS — R0609 Other forms of dyspnea: Secondary | ICD-10-CM

## 2022-02-07 DIAGNOSIS — E7800 Pure hypercholesterolemia, unspecified: Secondary | ICD-10-CM

## 2022-02-07 DIAGNOSIS — J432 Centrilobular emphysema: Secondary | ICD-10-CM

## 2022-02-07 DIAGNOSIS — Z9981 Dependence on supplemental oxygen: Secondary | ICD-10-CM

## 2022-02-07 DIAGNOSIS — I119 Hypertensive heart disease without heart failure: Secondary | ICD-10-CM

## 2022-02-07 DIAGNOSIS — Z91041 Radiographic dye allergy status: Secondary | ICD-10-CM

## 2022-02-07 DIAGNOSIS — J9622 Acute and chronic respiratory failure with hypercapnia: Secondary | ICD-10-CM

## 2022-02-07 DIAGNOSIS — I429 Cardiomyopathy, unspecified: Secondary | ICD-10-CM

## 2022-02-07 DIAGNOSIS — I2510 Atherosclerotic heart disease of native coronary artery without angina pectoris: Secondary | ICD-10-CM

## 2022-02-07 DIAGNOSIS — I5022 Chronic systolic (congestive) heart failure: Secondary | ICD-10-CM

## 2022-02-07 DIAGNOSIS — G2581 Restless legs syndrome: Secondary | ICD-10-CM

## 2022-02-07 DIAGNOSIS — E785 Hyperlipidemia, unspecified: Secondary | ICD-10-CM

## 2022-02-07 DIAGNOSIS — Z87891 Personal history of nicotine dependence: Secondary | ICD-10-CM

## 2022-02-07 DIAGNOSIS — I10 Essential (primary) hypertension: Secondary | ICD-10-CM

## 2022-02-07 DIAGNOSIS — R000 Tachycardia, unspecified: Secondary | ICD-10-CM

## 2022-02-07 MED ORDER — ISOSORBIDE MONONITRATE 30 MG PO TB24
30 mg | ORAL_TABLET | Freq: Every morning | ORAL | 3 refills | 90.00000 days | Status: AC
Start: 2022-02-07 — End: ?

## 2022-02-07 NOTE — Assessment & Plan Note
I think his current symptoms sound more like heartburn and suggested a course of omeprazole.  Will touch base with him in a week and if the heartburn symptoms have not improved we could consider the possibility of stress test, although I am not very anxious to subject him to any sort of procedures.

## 2022-02-07 NOTE — Assessment & Plan Note
He was most recently hospitalized in the summer for an exacerbation of COPD.
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
## 2022-02-07 NOTE — Assessment & Plan Note
Lab Results   Component Value Date    CHOL 138 10/27/2021    TRIG 140 10/27/2021    HDL 31 (L) 10/27/2021    LDL 79 10/27/2021    VLDL 28 10/27/2021    NONHDLCHOL 86 05/06/2012    CHOLHDLC 4 10/27/2021      LDL close to goal.

## 2022-02-17 ENCOUNTER — Encounter: Admit: 2022-02-17 | Discharge: 2022-02-17 | Payer: MEDICARE

## 2022-02-17 NOTE — Telephone Encounter
-----  Message from Asencion Noble sent at 02/07/2022  1:21 PM CST -----  Regarding: chest pain  Call to see if otc meds helped with epigastic pain

## 2022-02-17 NOTE — Telephone Encounter
Patient's wife reports epigastric pain much better after prilosec.  Advised to call if concerns or questions.

## 2022-03-13 ENCOUNTER — Encounter: Admit: 2022-03-13 | Discharge: 2022-03-13 | Payer: MEDICARE

## 2022-03-13 MED ORDER — POTASSIUM CHLORIDE 10 MEQ PO TBER
ORAL_TABLET | 3 refills | 30.00000 days | Status: AC
Start: 2022-03-13 — End: ?

## 2022-04-06 ENCOUNTER — Encounter: Admit: 2022-04-06 | Discharge: 2022-04-06 | Payer: MEDICARE

## 2022-04-06 MED ORDER — CARVEDILOL 6.25 MG PO TAB
6.25 mg | ORAL_TABLET | Freq: Two times a day (BID) | ORAL | 3 refills | 90.00000 days | Status: AC
Start: 2022-04-06 — End: ?

## 2022-04-21 ENCOUNTER — Encounter: Admit: 2022-04-21 | Discharge: 2022-04-21 | Payer: MEDICARE

## 2022-05-07 ENCOUNTER — Encounter: Admit: 2022-05-07 | Discharge: 2022-05-07 | Payer: MEDICARE

## 2022-05-07 MED ORDER — FUROSEMIDE 40 MG PO TAB
40 mg | ORAL_TABLET | Freq: Every day | ORAL | 3 refills
Start: 2022-05-07 — End: ?

## 2022-05-07 MED ORDER — EZETIMIBE 10 MG PO TAB
10 mg | ORAL_TABLET | Freq: Every day | ORAL | 3 refills
Start: 2022-05-07 — End: ?

## 2022-05-12 ENCOUNTER — Encounter: Admit: 2022-05-12 | Discharge: 2022-05-12 | Payer: MEDICARE

## 2022-09-27 ENCOUNTER — Encounter: Admit: 2022-09-27 | Discharge: 2022-09-27 | Payer: MEDICARE

## 2022-09-27 MED ORDER — ATORVASTATIN 40 MG PO TAB
40 mg | ORAL_TABLET | Freq: Every day | ORAL | 3 refills | Status: AC
Start: 2022-09-27 — End: ?

## 2022-11-21 ENCOUNTER — Encounter: Admit: 2022-11-21 | Discharge: 2022-11-21 | Payer: MEDICARE

## 2022-11-21 ENCOUNTER — Ambulatory Visit: Admit: 2022-11-21 | Discharge: 2022-11-21 | Payer: MEDICARE

## 2022-11-21 DIAGNOSIS — I2510 Atherosclerotic heart disease of native coronary artery without angina pectoris: Secondary | ICD-10-CM

## 2022-11-21 DIAGNOSIS — I255 Ischemic cardiomyopathy: Secondary | ICD-10-CM

## 2022-11-21 DIAGNOSIS — I429 Cardiomyopathy, unspecified: Secondary | ICD-10-CM

## 2022-11-21 DIAGNOSIS — R000 Tachycardia, unspecified: Secondary | ICD-10-CM

## 2022-11-21 DIAGNOSIS — Z951 Presence of aortocoronary bypass graft: Secondary | ICD-10-CM

## 2022-11-21 DIAGNOSIS — E785 Hyperlipidemia, unspecified: Secondary | ICD-10-CM

## 2022-11-21 DIAGNOSIS — E7800 Pure hypercholesterolemia, unspecified: Secondary | ICD-10-CM

## 2022-11-21 DIAGNOSIS — R0609 Other forms of dyspnea: Secondary | ICD-10-CM

## 2022-11-21 DIAGNOSIS — I119 Hypertensive heart disease without heart failure: Secondary | ICD-10-CM

## 2022-11-21 DIAGNOSIS — Z9981 Dependence on supplemental oxygen: Secondary | ICD-10-CM

## 2022-11-21 DIAGNOSIS — J45909 Unspecified asthma, uncomplicated: Secondary | ICD-10-CM

## 2022-11-21 DIAGNOSIS — I10 Essential (primary) hypertension: Secondary | ICD-10-CM

## 2022-11-21 DIAGNOSIS — G2581 Restless legs syndrome: Secondary | ICD-10-CM

## 2022-11-21 DIAGNOSIS — J449 Chronic obstructive pulmonary disease, unspecified: Secondary | ICD-10-CM

## 2022-11-21 DIAGNOSIS — Z91041 Radiographic dye allergy status: Secondary | ICD-10-CM

## 2022-11-21 DIAGNOSIS — I5022 Chronic systolic (congestive) heart failure: Secondary | ICD-10-CM

## 2022-11-21 DIAGNOSIS — Z87891 Personal history of nicotine dependence: Secondary | ICD-10-CM
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
## 2022-11-21 NOTE — Assessment & Plan Note
Lab Results   Component Value Date    CHOL 138 10/27/2021    TRIG 140 10/27/2021    HDL 31 (L) 10/27/2021    LDL 79 10/27/2021    VLDL 28 10/27/2021    NONHDLCHOL 86 05/06/2012    CHOLHDLC 4 10/27/2021      LDL close to goal on atorva 40 and Zetia 10 daily.

## 2022-11-21 NOTE — Assessment & Plan Note
When I last saw him in January, 2024, I thought he was having some GERD symptoms and recommended omeprazole.

## 2022-12-26 ENCOUNTER — Encounter: Admit: 2022-12-26 | Discharge: 2022-12-26 | Payer: MEDICARE

## 2022-12-26 MED ORDER — ISOSORBIDE MONONITRATE 30 MG PO TB24
30 mg | ORAL_TABLET | Freq: Every morning | ORAL | 3 refills | 90.00000 days | Status: AC
Start: 2022-12-26 — End: ?

## 2023-03-24 ENCOUNTER — Encounter: Admit: 2023-03-24 | Discharge: 2023-03-24 | Payer: MEDICARE

## 2023-05-15 ENCOUNTER — Encounter: Admit: 2023-05-15 | Discharge: 2023-05-15

## 2023-05-15 MED ORDER — EZETIMIBE 10 MG PO TAB
10 mg | ORAL_TABLET | Freq: Every day | ORAL | 0 refills | Status: AC
Start: 2023-05-15 — End: ?

## 2023-05-15 MED ORDER — FUROSEMIDE 40 MG PO TAB
40 mg | ORAL_TABLET | Freq: Every day | ORAL | 0 refills | 90.00000 days | Status: AC
Start: 2023-05-15 — End: ?

## 2023-05-15 MED ORDER — CARVEDILOL 6.25 MG PO TAB
6.25 mg | ORAL_TABLET | Freq: Two times a day (BID) | ORAL | 0 refills | 90.00000 days | Status: AC
Start: 2023-05-15 — End: ?

## 2023-07-23 ENCOUNTER — Encounter: Admit: 2023-07-23 | Discharge: 2023-07-23 | Payer: MEDICARE

## 2023-07-24 ENCOUNTER — Encounter: Admit: 2023-07-24 | Discharge: 2023-07-24 | Payer: MEDICARE

## 2023-07-24 ENCOUNTER — Ambulatory Visit: Admit: 2023-07-24 | Discharge: 2023-07-24 | Payer: MEDICARE

## 2023-07-24 DIAGNOSIS — E7800 Pure hypercholesterolemia, unspecified: Secondary | ICD-10-CM

## 2023-07-24 DIAGNOSIS — I2510 Atherosclerotic heart disease of native coronary artery without angina pectoris: Secondary | ICD-10-CM

## 2023-07-24 NOTE — Assessment & Plan Note
 He hasn't had angina symptoms.  His last evaluation was coronary arteriography in 2014 showing patent bypass grafts.  I'm not planning any sort of surveillance stress testing.

## 2023-07-24 NOTE — Assessment & Plan Note
 Lab Results   Component Value Date    CHOL 139 11/21/2022    TRIG 118 11/21/2022    HDL 37 (L) 11/21/2022    LDL 79 11/21/2022    VLDL 24 11/21/2022    NONHDLCHOL 86 05/06/2012    CHOLHDLC 4 11/21/2022      LDL close to goal,  Atorva 40 and Zetia 10 daily

## 2023-07-24 NOTE — Progress Notes
 Date of Service: 07/24/2023    Joseph Dougherty is a 68 y.o. male.       HPI     Georgette and Holley were in the [facility] today for follow-up regarding coronary disease.  They are planning to be with family up in [place]  again this summer.  He did well over the past winter months.     Georgette has done fairly well over the last 9 months.  His COPD is obviously quite severe.SABRA  He is not having any palpitations or syncope.         Vitals:    07/24/23 1406   BP: 104/75   BP Source: Arm, Left Upper   Pulse: 96   SpO2: 96%   O2 Device: Nasal cannula   O2 Liter Flow: 3 Lpm   PainSc: Zero   Weight: 73.8 kg (162 lb 9.6 oz)   Height: 162.6 cm (5' 4)     Body mass index is 27.91 kg/m?SABRA     Past Medical History  Patient Active Problem List    Diagnosis Date Noted    Acute on chronic respiratory failure with hypoxemia (CMS-HCC) 01/10/2021    COVID-19 01/10/2021    Acute on chronic respiratory failure with hypoxia and hypercapnia (CMS-HCC) 01/10/2021    Ischemic cardiomyopathy 05/31/2012    Dependence on supplemental oxygen 05/31/2012    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   11/05 - ECOD: LVEF 50%, mild posterior hypokinesis.  (EF 59% per LVgram).                 Status post cardiac rehab participation.   5/06 - Persantine Myoview stress test ([place], [place] ):  EF 60%, no ischemia   12/08 - Cardiac cath: mild LV dysfunction with previous inferior wall infarction.                Two vessel CAD with patent vein graft to intermediate vessel and patent                vein graft to RPLV branch. No significant LAD or Cx disease. Allergic                 reaction to contrast dye with manifestation of hives treated with Benadryl                and Solu-Medrol.  05/2012 - Cath @ [place].  No significant progression of CAD.  Grafts patent.  EF 45-50%.      COPD (chronic obstructive pulmonary disease) (CMS-HCC) 03/30/2006     He has a long smoking history and was first told he had COPD in 2008 and initially saw Dr. Leonora. Nicholaus in the fellows clinic.  He has required hospitalization nearly yearly the last 3-4 years.  He thinks he may have had a pneumothorax in the past also.  He was started on nocturnal oxygen late 2008.   He has normal alpha one antitrypsin testing in 2008.   He quit smoking in 1/13.  He has been on Spiriva and Advair for several years but switched to Symbicort in 2014.  Azithromycin started 11/2013  Review of Systems   Constitutional: Negative.   HENT: Negative.     Eyes: Negative.    Cardiovascular: Negative.    Respiratory: Negative.     Endocrine: Negative.    Hematologic/Lymphatic: Negative.    Skin: Negative.    Musculoskeletal: Negative.    Gastrointestinal: Negative.    Genitourinary: Negative.    Neurological: Negative.    Psychiatric/Behavioral: Negative.     Allergic/Immunologic: Negative.        Physical Exam    Physical Exam   General Appearance: no distress   Skin: warm, no ulcers or xanthomas   Digits and Nails: no cyanosis or clubbing   Eyes: conjunctivae and lids normal, pupils are equal and round   Teeth/Gums/Palate: dentition unremarkable, no lesions   Lips & Oral Mucosa: no pallor or cyanosis   Neck Veins: normal JVP , neck veins are not distended   Thyroid: no nodules, masses, tenderness or enlargement   Chest Inspection: chest is normal in appearance   Respiratory Effort: breathing comfortably, no respiratory distress, supplemental oxygen via nasal cannula   Auscultation/Percussion: lungs clear to auscultation, diffusely decreased breath sounds  PMI: PMI not enlarged or displaced   Cardiac Rhythm: regular rhythm and normal rate   Cardiac Auscultation: S1, S2 normal, no rub, no gallop   Murmurs: no murmur   Peripheral Circulation: normal peripheral circulation   Carotid Arteries: normal carotid upstroke bilaterally, no bruits   Radial Arteries: normal symmetric radial pulses   Abdominal Aorta: no abdominal aortic bruit   Pedal Pulses: normal symmetric pedal pulses   Lower Extremity Edema: no lower extremity edema   Abdominal Exam: soft, non-tender, no masses, bowel sounds normal   Liver & Spleen: no organomegaly   Gait & Station: walks without assistance   Muscle Strength: normal muscle tone   Orientation: oriented to time, place and person   Affect & Mood: appropriate and sustained affect   Language and Memory: patient responsive and seems to comprehend information   Neurologic Exam: neurological assessment grossly intact   Other: moves all extremities      Cardiovascular Health Factors  Vitals BP Readings from Last 3 Encounters:   07/24/23 104/75   11/21/22 101/74   02/07/22 108/68     Wt Readings from Last 3 Encounters:   07/24/23 73.8 kg (162 lb 9.6 oz)   11/21/22 72.4 kg (159 lb 9.6 oz)   02/07/22 71.7 kg (158 lb)     BMI Readings from Last 3 Encounters:   07/24/23 27.91 kg/m?   11/21/22 27.40 kg/m?   02/07/22 27.12 kg/m?      Smoking Social History     Tobacco Use   Smoking Status Former    Current packs/day: 0.00    Average packs/day: 0.5 packs/day for 41.0 years (20.5 ttl pk-yrs)    Types: Cigarettes    Start date: 02/21/1970    Quit date: 02/22/2011    Years since quitting: 12.4   Smokeless Tobacco Never   Tobacco Comments    PREVIOUSLY 3PPD; AFTER MI, 1/2 PPD      Lipid Profile Cholesterol   Date Value Ref Range Status   11/21/2022 139  Final     HDL   Date Value Ref Range Status   11/21/2022 37 (L) >=40 Final     LDL   Date Value Ref Range Status   11/21/2022 79  Final     Triglycerides   Date Value Ref Range Status   11/21/2022 118  Final  Blood Sugar Hemoglobin A1C   Date Value Ref Range Status   11/24/2020 5.9  Final     Glucose   Date Value Ref Range Status   11/21/2022 67 (L) 70 - 105 Final   09/23/2021 314 (H) 70 - 105 Final   01/14/2021 120 (H) 70 - 100 MG/DL Final     Glucose, POC   Date Value Ref Range Status   01/14/2021 123 (H) 70 - 100 MG/DL Final   87/91/7977 822 (H) 70 - 100 MG/DL Final   87/91/7977 802 (H) 70 - 100 MG/DL Final          Problems Addressed Today  Encounter Diagnoses   Name Primary?    Coronary artery disease involving native coronary artery of native heart without angina pectoris Yes    Hypercholesterolemia        Assessment and Plan       CAD (coronary artery disease)  He hasn't had angina symptoms.  His last evaluation was coronary arteriography in 2014 showing patent bypass grafts.  I'm not planning any sort of surveillance stress testing.    Hypercholesterolemia  Lab Results   Component Value Date    CHOL 139 11/21/2022    TRIG 118 11/21/2022    HDL 37 (L) 11/21/2022    LDL 79 11/21/2022    VLDL 24 11/21/2022    NONHDLCHOL 86 05/06/2012    CHOLHDLC 4 11/21/2022      LDL close to goal,  Atorva 40 and Zetia 10 daily      Current Medications (including today's revisions)   albuterol (VENTOLIN HFA) 90 mcg/actuation inhaler Inhale two puffs by mouth into the lungs every 6 hours as needed for Wheezing or Shortness of Breath. Shake well before use.    albuterol-ipratropium (DUONEB) 0.5 mg-3 mg(2.5 mg base)/3 mL nebulizer solution Inhale 3 mL solution by nebulizer as directed as Needed.    ALPRAZolam (XANAX) 0.5 mg tablet Take one-half tablet by mouth at bedtime as needed for Anxiety.    aspirin 325 mg tablet Take one tablet by mouth daily.    atorvastatin (LIPITOR) 40 mg tablet TAKE 1 TABLET BY MOUTH DAILY    azithromycin (ZITHROMAX) 250 mg tablet 1 Tab three times weekly. .    budesonide-formoterol HFA (SYMBICORT) 160-4.5 mcg/actuation aerosol inhaler Inhale two puffs by mouth into the lungs twice daily.    carvediloL (COREG) 6.25 mg tablet TAKE 1 TABLET BY MOUTH TWICE  DAILY WITH MEALS    diclofenac sodium (VOLTAREN) 1 % topical gel Apply two g topically to affected area four times daily as needed (pain). Indications: Back pain    ezetimibe (ZETIA) 10 mg tablet TAKE 1 TABLET BY MOUTH DAILY    FLUoxetine (PROZAC) 40 mg capsule Take one capsule by mouth daily.    furosemide (LASIX) 40 mg tablet TAKE 1 TABLET BY MOUTH DAILY    glipiZIDE CR (GLUCOTROL XL) 2.5 mg tablet Take one tablet by mouth daily with breakfast.    isosorbide mononitrate ER (IMDUR) 30 mg tablet,extended release 24 hr TAKE 1 TABLET BY MOUTH IN THE  MORNING    levocetirizine (XYZAL) 5 mg tablet Take one tablet by mouth daily as needed.    nitroglycerin (NITROSTAT) 0.4 mg tablet Place one tablet under tongue every 5 minutes as needed for Chest Pain. FOR CHEST PAIN    potassium chloride (KLOR-CON 10) 10 mEq tablet TAKE 2 TABLETS BY MOUTH DAILY  WITH A MEAL AND A FULL GLASS OF  WATER  temazepam (RESTORIL) 15 mg capsule Take 1 Cap by mouth at bedtime as needed. (Patient taking differently: Take one capsule by mouth at bedtime daily.)    tiotropium bromide (SPIRIVA) 18 mcg capsule for inhaler Place one capsule into inhaler and inhale into lungs as directed daily.     Total time spent on today's office visit was 30 minutes.  This includes face-to-face in person visit with patient as well as nonface-to-face time including review of the EMR, outside records, labs, radiologic studies, echocardiogram & other cardiovascular studies, formation of treatment plan, after visit summary, future disposition, and lastly on documentation.

## 2023-07-28 ENCOUNTER — Encounter: Admit: 2023-07-28 | Discharge: 2023-07-28 | Payer: MEDICARE

## 2023-07-28 MED ORDER — FUROSEMIDE 40 MG PO TAB
40 mg | ORAL_TABLET | Freq: Every day | ORAL | 3 refills | 90.00000 days | Status: AC
Start: 2023-07-28 — End: ?

## 2023-08-08 ENCOUNTER — Encounter: Admit: 2023-08-08 | Discharge: 2023-08-08 | Payer: MEDICARE

## 2023-08-08 MED ORDER — CARVEDILOL 6.25 MG PO TAB
6.25 mg | ORAL_TABLET | Freq: Two times a day (BID) | ORAL | 3 refills | 90.00000 days | Status: AC
Start: 2023-08-08 — End: ?

## 2023-08-31 ENCOUNTER — Encounter: Admit: 2023-08-31 | Discharge: 2023-08-31 | Payer: MEDICARE

## 2023-08-31 MED ORDER — EZETIMIBE 10 MG PO TAB
10 mg | ORAL_TABLET | Freq: Every day | ORAL | 3 refills | 90.00000 days | Status: AC
Start: 2023-08-31 — End: ?

## 2023-10-03 ENCOUNTER — Encounter: Admit: 2023-10-03 | Discharge: 2023-10-03 | Payer: MEDICARE

## 2023-10-03 MED ORDER — ATORVASTATIN 40 MG PO TAB
40 mg | ORAL_TABLET | Freq: Every day | ORAL | 3 refills | 90.00000 days | Status: AC
Start: 2023-10-03 — End: ?

## 2023-12-29 ENCOUNTER — Encounter: Admit: 2023-12-29 | Discharge: 2023-12-29 | Payer: MEDICARE

## 2024-03-26 IMAGING — CR [ID]
2 series · 2 of 2 positions shown · non-contrast
Comparison: none

[w chest pa]
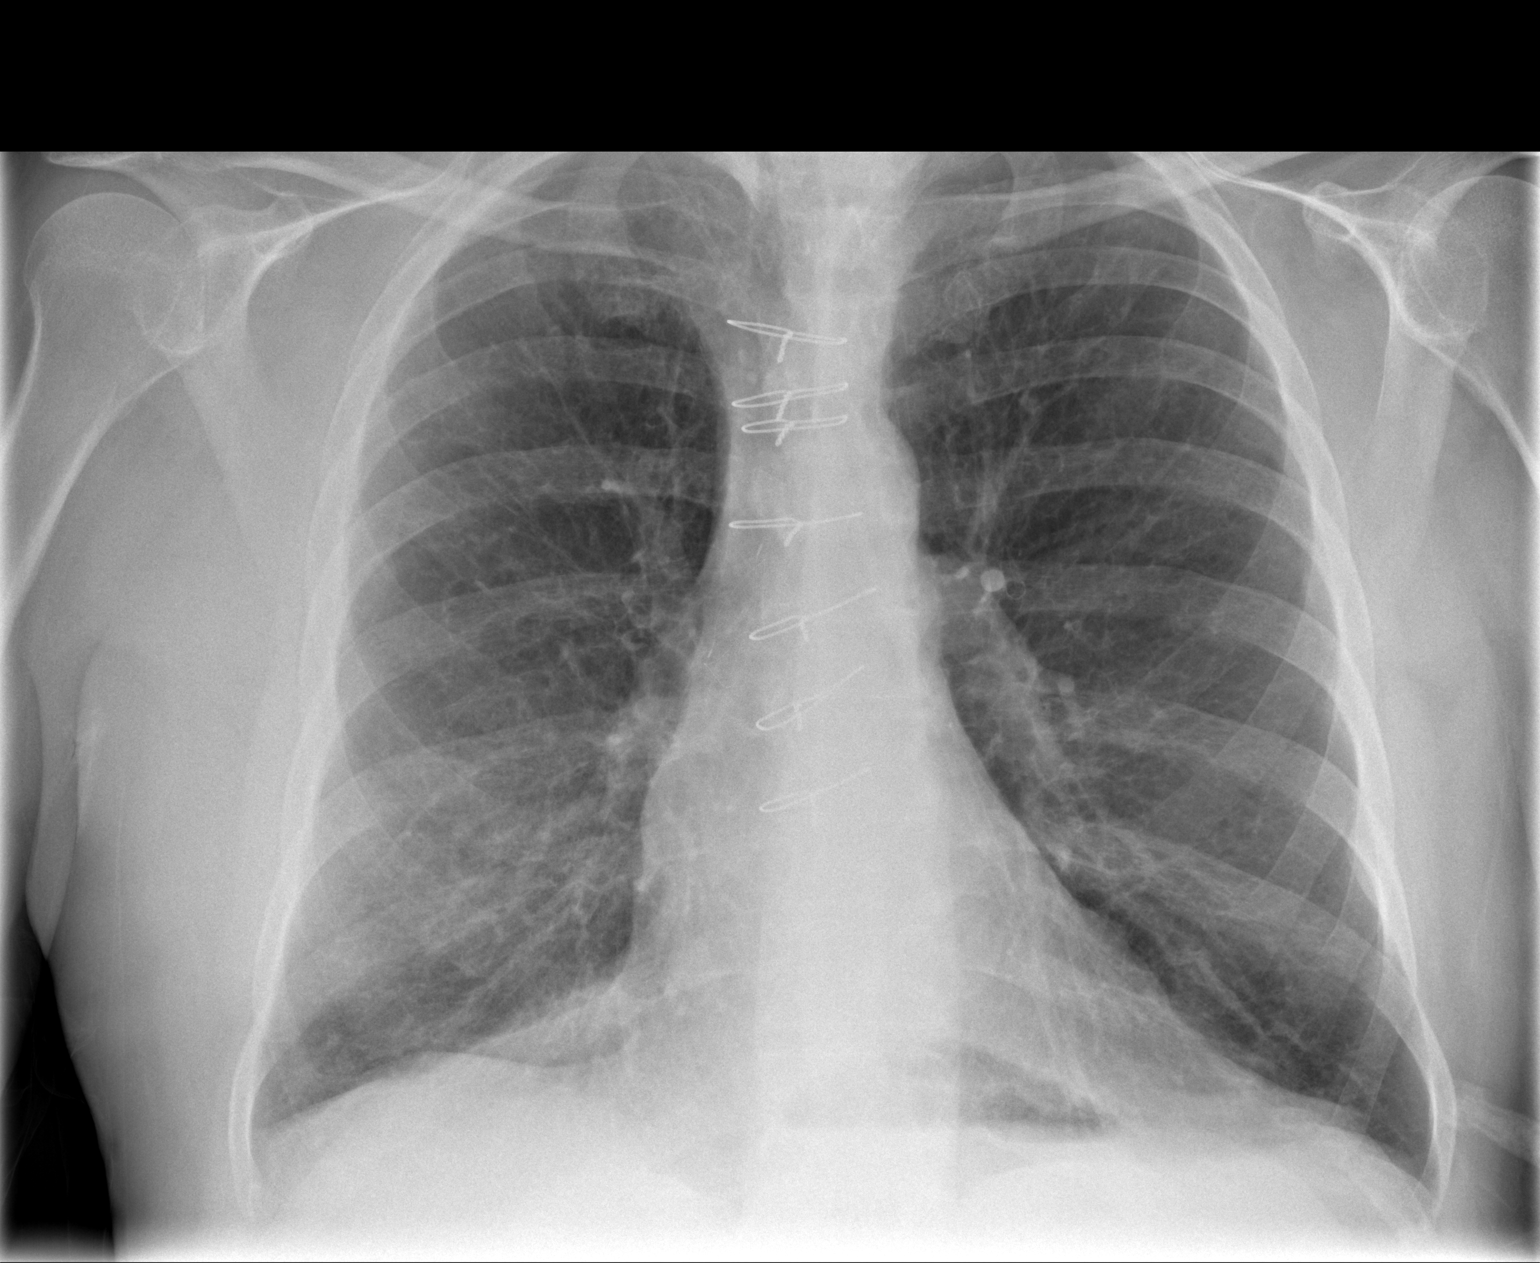

[w chest lat]
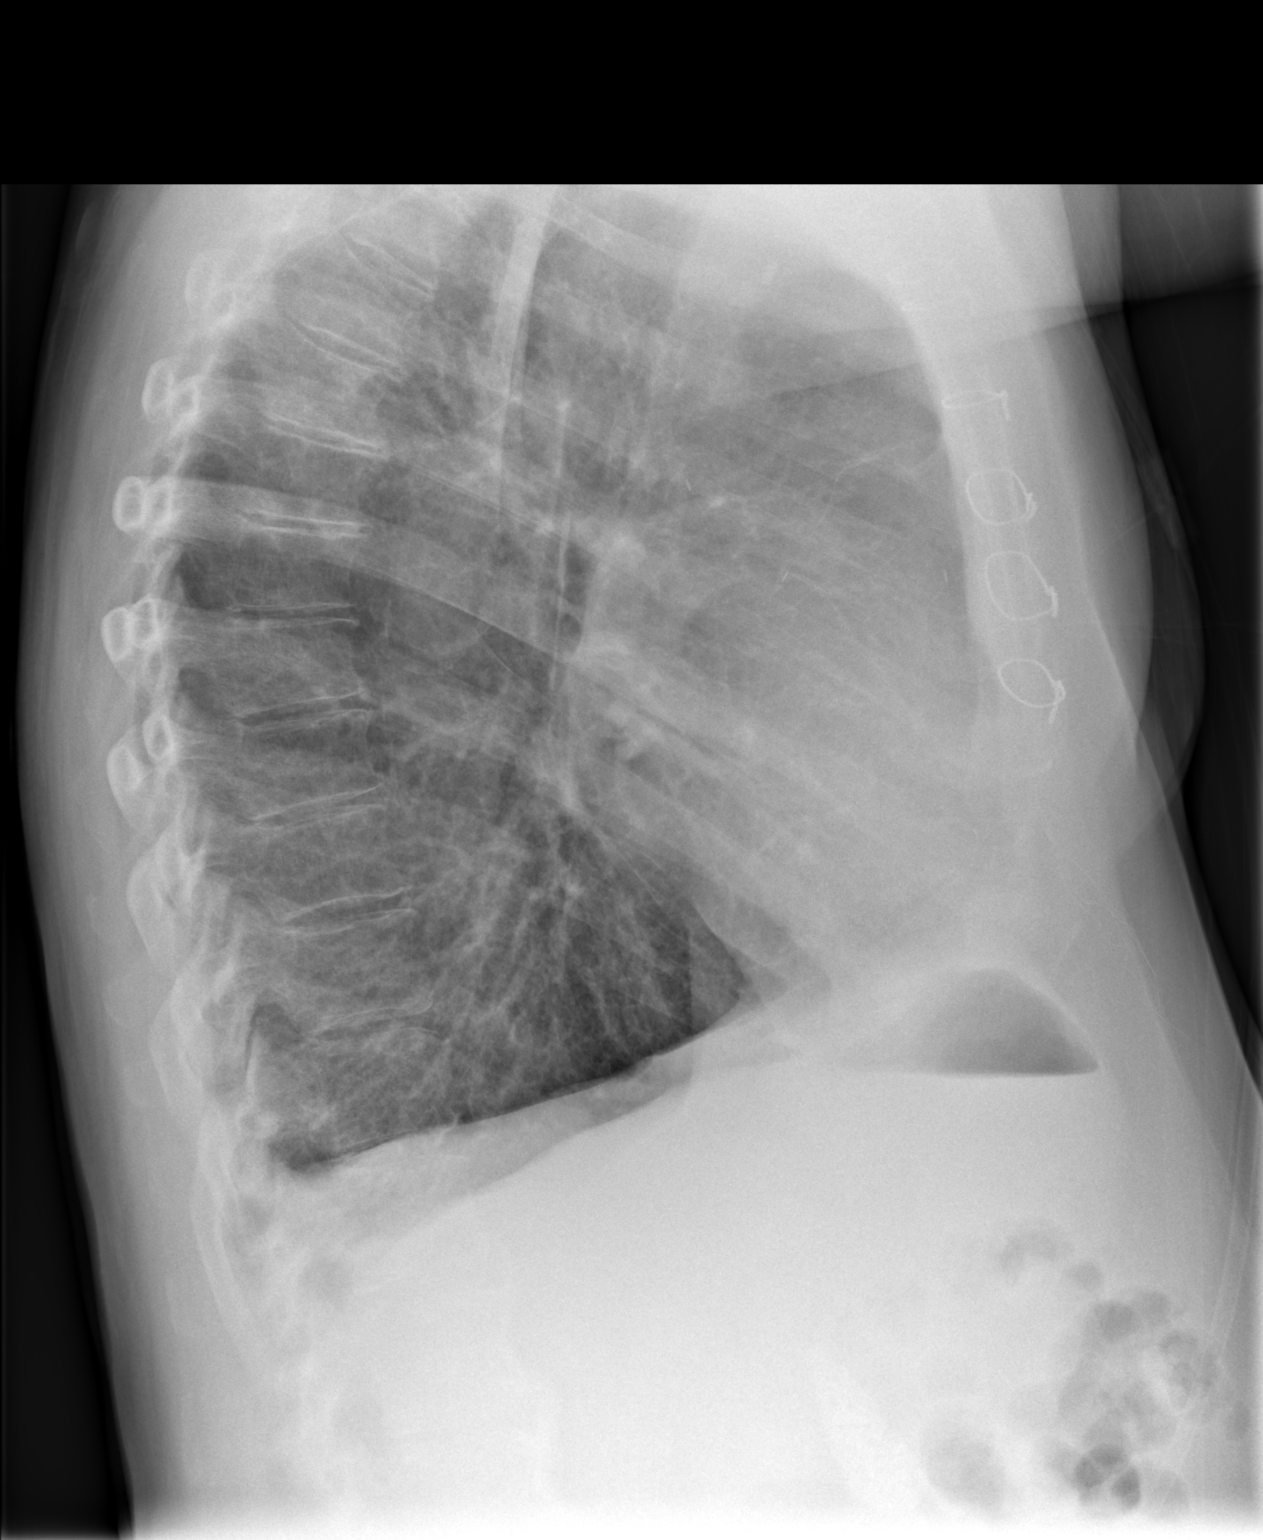

[2 of 2 positions shown; findings below may reference images not displayed]

DIAGNOSTIC STUDIES

EXAM

XR chest 2V

INDICATION

copd exacerbation
copd  hx chf, htn, cabg

TECHNIQUE

PA and lateral views of the chest.

COMPARISONS

September 21, 2021

FINDINGS

Cardiac silhouette is unchanged with prior sternotomy. Advanced COPD is re-demonstrated. No acute
infiltrates are seen. Osseous structures are stable.

IMPRESSION

Advanced COPD. No acute infiltrates are seen

Tech Notes:

copd
hx chf, htn, cabg
